# Patient Record
Sex: Female | Born: 1935 | Race: White | Hispanic: No | Smoking: Former smoker
Health system: Southern US, Community
[De-identification: ages and names within clinical notes are randomized; demographics above are authoritative.]

## PROBLEM LIST (undated history)

## (undated) DIAGNOSIS — H3530 Unspecified macular degeneration: Secondary | ICD-10-CM

## (undated) DIAGNOSIS — F0280 Dementia in other diseases classified elsewhere without behavioral disturbance: Secondary | ICD-10-CM

## (undated) DIAGNOSIS — R920 Mammographic microcalcification found on diagnostic imaging of breast: Secondary | ICD-10-CM

## (undated) DIAGNOSIS — G309 Alzheimer's disease, unspecified: Secondary | ICD-10-CM

## (undated) DIAGNOSIS — Z8489 Family history of other specified conditions: Secondary | ICD-10-CM

## (undated) DIAGNOSIS — I10 Essential (primary) hypertension: Secondary | ICD-10-CM

## (undated) DIAGNOSIS — E039 Hypothyroidism, unspecified: Secondary | ICD-10-CM

## (undated) DIAGNOSIS — C679 Malignant neoplasm of bladder, unspecified: Secondary | ICD-10-CM

## (undated) DIAGNOSIS — C539 Malignant neoplasm of cervix uteri, unspecified: Secondary | ICD-10-CM

## (undated) DIAGNOSIS — Z9841 Cataract extraction status, right eye: Secondary | ICD-10-CM

## (undated) DIAGNOSIS — G2581 Restless legs syndrome: Secondary | ICD-10-CM

## (undated) DIAGNOSIS — C50919 Malignant neoplasm of unspecified site of unspecified female breast: Secondary | ICD-10-CM

## (undated) DIAGNOSIS — D0590 Unspecified type of carcinoma in situ of unspecified breast: Secondary | ICD-10-CM

## (undated) DIAGNOSIS — Z9842 Cataract extraction status, left eye: Secondary | ICD-10-CM

## (undated) DIAGNOSIS — C189 Malignant neoplasm of colon, unspecified: Secondary | ICD-10-CM

## (undated) HISTORY — DX: Essential (primary) hypertension: I10

## (undated) HISTORY — PX: BREAST CYST EXCISION: SHX579

## (undated) HISTORY — PX: APPENDECTOMY: SHX54

## (undated) HISTORY — DX: Malignant neoplasm of unspecified site of unspecified female breast: C50.919

## (undated) HISTORY — DX: Mammographic microcalcification found on diagnostic imaging of breast: R92.0

## (undated) HISTORY — DX: Unspecified type of carcinoma in situ of unspecified breast: D05.90

## (undated) HISTORY — DX: Unspecified macular degeneration: H35.30

## (undated) HISTORY — PX: EYE SURGERY: SHX253

---

## 1958-06-25 HISTORY — PX: BREAST BIOPSY: SHX20

## 1970-06-25 HISTORY — PX: ABDOMINAL HYSTERECTOMY: SHX81

## 1989-06-25 DIAGNOSIS — I10 Essential (primary) hypertension: Secondary | ICD-10-CM

## 1989-06-25 HISTORY — DX: Essential (primary) hypertension: I10

## 1990-06-25 DIAGNOSIS — C189 Malignant neoplasm of colon, unspecified: Secondary | ICD-10-CM

## 1990-06-25 HISTORY — DX: Malignant neoplasm of colon, unspecified: C18.9

## 1990-06-25 HISTORY — PX: COLON SURGERY: SHX602

## 2001-06-25 HISTORY — PX: POLYPECTOMY: SHX149

## 2004-04-13 ENCOUNTER — Ambulatory Visit: Payer: Self-pay | Admitting: Oncology

## 2004-04-25 ENCOUNTER — Ambulatory Visit: Payer: Self-pay | Admitting: Oncology

## 2004-05-16 ENCOUNTER — Ambulatory Visit: Payer: Self-pay

## 2005-03-05 ENCOUNTER — Ambulatory Visit: Payer: Self-pay | Admitting: Gastroenterology

## 2005-04-12 ENCOUNTER — Ambulatory Visit: Payer: Self-pay | Admitting: Oncology

## 2005-04-25 ENCOUNTER — Ambulatory Visit: Payer: Self-pay | Admitting: Oncology

## 2005-06-25 HISTORY — PX: COLONOSCOPY: SHX174

## 2005-08-28 ENCOUNTER — Ambulatory Visit: Payer: Self-pay

## 2005-08-30 ENCOUNTER — Ambulatory Visit: Payer: Self-pay

## 2006-04-15 ENCOUNTER — Ambulatory Visit: Payer: Self-pay | Admitting: Oncology

## 2006-04-25 ENCOUNTER — Ambulatory Visit: Payer: Self-pay | Admitting: Oncology

## 2006-09-12 ENCOUNTER — Ambulatory Visit: Payer: Self-pay

## 2006-09-25 ENCOUNTER — Ambulatory Visit: Payer: Self-pay

## 2007-03-26 ENCOUNTER — Ambulatory Visit: Payer: Self-pay | Admitting: Oncology

## 2007-04-17 ENCOUNTER — Ambulatory Visit: Payer: Self-pay | Admitting: Oncology

## 2007-04-26 ENCOUNTER — Ambulatory Visit: Payer: Self-pay | Admitting: Oncology

## 2007-05-26 ENCOUNTER — Ambulatory Visit: Payer: Self-pay | Admitting: Oncology

## 2007-06-26 ENCOUNTER — Ambulatory Visit: Payer: Self-pay | Admitting: Oncology

## 2007-06-26 DIAGNOSIS — C679 Malignant neoplasm of bladder, unspecified: Secondary | ICD-10-CM

## 2007-06-26 HISTORY — PX: BLADDER SURGERY: SHX569

## 2007-06-26 HISTORY — DX: Malignant neoplasm of bladder, unspecified: C67.9

## 2007-07-28 ENCOUNTER — Ambulatory Visit: Payer: Self-pay | Admitting: Urology

## 2007-08-24 ENCOUNTER — Ambulatory Visit: Payer: Self-pay | Admitting: Oncology

## 2007-09-16 ENCOUNTER — Ambulatory Visit: Payer: Self-pay | Admitting: General Surgery

## 2008-03-25 ENCOUNTER — Ambulatory Visit: Payer: Self-pay | Admitting: Oncology

## 2008-04-15 ENCOUNTER — Ambulatory Visit: Payer: Self-pay | Admitting: Oncology

## 2008-04-25 ENCOUNTER — Ambulatory Visit: Payer: Self-pay | Admitting: Oncology

## 2008-06-25 HISTORY — PX: CYSTOSCOPY: SUR368

## 2008-09-16 ENCOUNTER — Ambulatory Visit: Payer: Self-pay | Admitting: Internal Medicine

## 2009-03-25 ENCOUNTER — Ambulatory Visit: Payer: Self-pay | Admitting: Oncology

## 2009-04-18 ENCOUNTER — Ambulatory Visit: Payer: Self-pay | Admitting: Oncology

## 2009-04-25 ENCOUNTER — Ambulatory Visit: Payer: Self-pay | Admitting: Oncology

## 2009-06-25 DIAGNOSIS — D0590 Unspecified type of carcinoma in situ of unspecified breast: Secondary | ICD-10-CM

## 2009-06-25 DIAGNOSIS — C50919 Malignant neoplasm of unspecified site of unspecified female breast: Secondary | ICD-10-CM

## 2009-06-25 HISTORY — PX: OTHER SURGICAL HISTORY: SHX169

## 2009-06-25 HISTORY — DX: Unspecified type of carcinoma in situ of unspecified breast: D05.90

## 2009-06-25 HISTORY — PX: BREAST BIOPSY: SHX20

## 2009-06-25 HISTORY — DX: Malignant neoplasm of unspecified site of unspecified female breast: C50.919

## 2009-10-17 ENCOUNTER — Ambulatory Visit: Payer: Self-pay | Admitting: Internal Medicine

## 2009-10-19 ENCOUNTER — Ambulatory Visit: Payer: Self-pay | Admitting: Internal Medicine

## 2009-11-14 ENCOUNTER — Ambulatory Visit: Payer: Self-pay | Admitting: General Surgery

## 2009-11-15 ENCOUNTER — Ambulatory Visit: Payer: Self-pay | Admitting: General Surgery

## 2009-11-15 HISTORY — PX: BREAST SURGERY: SHX581

## 2009-11-23 ENCOUNTER — Ambulatory Visit: Payer: Self-pay | Admitting: Radiation Oncology

## 2009-12-23 ENCOUNTER — Ambulatory Visit: Payer: Self-pay | Admitting: Radiation Oncology

## 2010-01-23 ENCOUNTER — Ambulatory Visit: Payer: Self-pay | Admitting: Radiation Oncology

## 2010-04-24 ENCOUNTER — Ambulatory Visit: Payer: Self-pay | Admitting: General Surgery

## 2010-06-25 DIAGNOSIS — H3530 Unspecified macular degeneration: Secondary | ICD-10-CM

## 2010-06-25 HISTORY — DX: Unspecified macular degeneration: H35.30

## 2010-11-01 ENCOUNTER — Ambulatory Visit: Payer: Self-pay | Admitting: General Surgery

## 2011-01-05 ENCOUNTER — Ambulatory Visit: Payer: Self-pay | Admitting: Surgery

## 2011-05-07 ENCOUNTER — Ambulatory Visit: Payer: Self-pay | Admitting: General Surgery

## 2011-11-29 ENCOUNTER — Ambulatory Visit: Payer: Self-pay | Admitting: General Surgery

## 2012-09-30 ENCOUNTER — Ambulatory Visit: Payer: Self-pay | Admitting: Internal Medicine

## 2012-12-01 ENCOUNTER — Ambulatory Visit: Payer: Self-pay | Admitting: General Surgery

## 2012-12-02 ENCOUNTER — Encounter: Payer: Self-pay | Admitting: General Surgery

## 2012-12-11 ENCOUNTER — Ambulatory Visit (INDEPENDENT_AMBULATORY_CARE_PROVIDER_SITE_OTHER): Payer: Medicare Other | Admitting: General Surgery

## 2012-12-11 ENCOUNTER — Encounter: Payer: Self-pay | Admitting: General Surgery

## 2012-12-11 VITALS — BP 138/76 | HR 76 | Resp 12 | Ht 64.0 in | Wt 192.0 lb

## 2012-12-11 DIAGNOSIS — Z853 Personal history of malignant neoplasm of breast: Secondary | ICD-10-CM | POA: Insufficient documentation

## 2012-12-11 NOTE — Progress Notes (Signed)
Patient ID: Leslie Osborn, female   DOB: [birth year], 79 y.o.   MRN: [id number]  Chief Complaint  Patient presents with  . Other    mammogram    HPI Leslie Osborn is a 79 y.o. female.  Who presents for a follow up mammogram. The most recent mammogram was done on 12-01-12.  Patient does perform regular self breast checks and gets regular mammograms done. No family history of breast cancer.  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  HPI  Past Medical History  Diagnosis Date  . Hypertension 1991  . Mammographic microcalcification   . Carcinoma in situ of breast 2011    R-breast  . Macular degeneration 2012  . Cancer of breast 2011    R-Breast DCIS  . Cancer of bladder 2009    Past Surgical History  Procedure Laterality Date  . Abdominal hysterectomy  1972  . Colon surgery  1992    resection  . Eye surgery  2000, 2010    cataract  . Breast surgery Right 11/15/09    lumpectomy  . Breast biopsy  1960  . Cystoscopy  2010  . Bladder surgery  2009  . Mammosite balloon placement  2011    Removed in 2011  . Polypectomy  2003  . Colonoscopy  2007    History reviewed. No pertinent family history.  Social History History  Substance Use Topics  . Smoking status: Never Smoker   . Smokeless tobacco: Never Used  . Alcohol Use: No    No Known Allergies  Current Outpatient Prescriptions  Medication Sig Dispense Refill  . alendronate (FOSAMAX) 70 MG tablet Take 70 mg by mouth every 7 (seven) days. Take with a full glass of water on an empty stomach.      Marland Kitchen atenolol (TENORMIN) 100 MG tablet Take 100 mg by mouth daily.      . bevacizumab (AVASTIN) 1.25 mg/0.1 mL SOLN 1.25 mg by Intravitreal route every 3 (three) months.      . Calcium Carbonate-Vitamin D (CALCIUM 500 + D PO) Take 3 tablets by mouth daily.      . cyanocobalamin 500 MCG tablet Take 500 mcg by mouth daily.      . folic acid (FOLVITE) 800 MCG tablet Take 400 mcg by mouth  daily.      Marland Kitchen levothyroxine (SYNTHROID, LEVOTHROID) 100 MCG tablet Take 100 mcg by mouth daily before breakfast.      . lisinopril (PRINIVIL,ZESTRIL) 20 MG tablet Take 40 mg by mouth daily.      . Multiple Vitamins-Minerals (OCUTABS) TABS Take 2 tablets by mouth daily.      . Omega 3-6-9 Fatty Acids (OMEGA 3-6-9 COMPLEX) CAPS Take 2 capsules by mouth daily.      . raloxifene (EVISTA) 60 MG tablet Take 60 mg by mouth daily.      . simvastatin (ZOCOR) 20 MG tablet Take 20 mg by mouth every evening.      . vitamin C (ASCORBIC ACID) 500 MG tablet Take 1,000 mg by mouth daily.       No current facility-administered medications for this visit.    Review of Systems Review of Systems  Constitutional: Negative.   Respiratory: Negative.   Cardiovascular: Negative.     Blood pressure 138/76, pulse 76, resp. rate 12, height 5\' 4"  (1.626 m), weight 192 lb (87.091 kg).  Physical Exam Physical Exam  Constitutional: She appears well-developed and well-nourished.  Eyes: Conjunctivae are normal.  Neck:  No tracheal deviation present. No mass and no thyromegaly present.  Cardiovascular: Normal rate and regular rhythm.   Pulmonary/Chest: Effort normal and breath sounds normal. Right breast exhibits no inverted nipple, no mass, no nipple discharge, no skin change and no tenderness. Left breast exhibits no inverted nipple, no mass, no nipple discharge, no skin change and no tenderness.  Lymphadenopathy:    She has no cervical adenopathy.    She has no axillary adenopathy.  Neurological: She is alert.  Skin: Skin is warm and dry.   Sebaceous cyst right axilla area   well healed right breast incision  Data Reviewed Mammogram reviewed and stable with post surgical changes in right breast.  Assessment    Stable exam    Plan    Annual mammogram and office visit.       Angus Amini G 12/11/2012, 7:43 PM

## 2012-12-11 NOTE — Patient Instructions (Addendum)
Continue self breast exams. Call office for any new breast issues or concerns. 1 year with mammogram and office visit

## 2013-12-03 ENCOUNTER — Encounter: Payer: Self-pay | Admitting: General Surgery

## 2013-12-03 ENCOUNTER — Ambulatory Visit: Payer: Self-pay | Admitting: General Surgery

## 2013-12-14 ENCOUNTER — Ambulatory Visit: Payer: Medicare Other | Admitting: General Surgery

## 2013-12-15 ENCOUNTER — Ambulatory Visit (INDEPENDENT_AMBULATORY_CARE_PROVIDER_SITE_OTHER): Payer: Medicare Other | Admitting: General Surgery

## 2013-12-15 ENCOUNTER — Encounter: Payer: Self-pay | Admitting: General Surgery

## 2013-12-15 VITALS — BP 162/82 | HR 70 | Resp 12 | Ht 64.5 in | Wt 197.0 lb

## 2013-12-15 DIAGNOSIS — D0590 Unspecified type of carcinoma in situ of unspecified breast: Secondary | ICD-10-CM

## 2013-12-15 DIAGNOSIS — D0511 Intraductal carcinoma in situ of right breast: Secondary | ICD-10-CM

## 2013-12-15 NOTE — Patient Instructions (Signed)
Follow up in one year with bilateral diagnostic mammogram and office visit.

## 2013-12-15 NOTE — Progress Notes (Signed)
Patient ID: Leslie Osborn, female   DOB: 07/29/1925, 80 y.o.   MRN: [id number]  Chief Complaint  Patient presents with  . Follow-up    mammogram    HPI Leslie Osborn is a 80 y.o. female.  who presents for a follow up mammogram breast evaluation. The most recent mammogram was done on 12-03-13.  Patient does perform regular self breast checks and gets regular mammograms done.  No new breast issues. No family history of breast cancer. 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   HPI  Past Medical History  Diagnosis Date  . Hypertension 1991  . Mammographic microcalcification   . Carcinoma in situ of breast 2011    R-breast  . Macular degeneration 2012  . Cancer of breast 2011    R-Breast DCIS, ER/PR negative  . Cancer of bladder 2009    Past Surgical History  Procedure Laterality Date  . Abdominal hysterectomy  1972  . Colon surgery  1992    resection  . Eye surgery  2000, 2010    cataract  . Breast surgery Right 11/15/09    lumpectomy  . Breast biopsy  1960  . Cystoscopy  2010  . Bladder surgery  2009  . Mammosite balloon placement  2011    Removed in 2011  . Polypectomy  2003  . Colonoscopy  2007    History reviewed. No pertinent family history.  Social History History  Substance Use Topics  . Smoking status: Never Smoker   . Smokeless tobacco: Never Used  . Alcohol Use: No    No Known Allergies  Current Outpatient Prescriptions  Medication Sig Dispense Refill  . alendronate (FOSAMAX) 70 MG tablet Take 70 mg by mouth every 7 (seven) days. Take with a full glass of water on an empty stomach.      Marland Kitchen atenolol (TENORMIN) 100 MG tablet Take 100 mg by mouth daily.      . Calcium Carbonate-Vitamin D (CALCIUM 500 + D PO) Take 3 tablets by mouth daily.      . folic acid (FOLVITE) 810 MCG tablet Take 400 mcg by mouth daily.      Marland Kitchen levothyroxine (SYNTHROID, LEVOTHROID) 100 MCG tablet Take 100 mcg by mouth daily before  breakfast.      . lisinopril (PRINIVIL,ZESTRIL) 20 MG tablet Take 40 mg by mouth daily.      . Multiple Vitamins-Minerals (OCUTABS) TABS Take 2 tablets by mouth daily.      Marland Kitchen PHOSPHATIDYLSERINE PO Take 500 mg by mouth daily.      . raloxifene (EVISTA) 60 MG tablet Take 60 mg by mouth daily.      . simvastatin (ZOCOR) 20 MG tablet Take 20 mg by mouth every evening.      . vitamin B-12 (CYANOCOBALAMIN) 500 MCG tablet Take 500 mcg by mouth daily.      . vitamin C (ASCORBIC ACID) 500 MG tablet Take 1,000 mg by mouth daily.       No current facility-administered medications for this visit.    Review of Systems Review of Systems  Constitutional: Negative.   Respiratory: Negative.   Cardiovascular: Negative.     Blood pressure 162/82, pulse 70, resp. rate 12, height 5' 4.5" (1.638 m), weight 197 lb (89.359 kg).  Physical Exam Physical Exam  Constitutional: She is oriented to person, place, and time. She appears well-developed and well-nourished.  Eyes: Conjunctivae are normal. No scleral icterus.  Neck: Neck supple.  Cardiovascular: Normal rate, regular rhythm and normal heart sounds.   Pulmonary/Chest: Effort normal and breath sounds normal. Right breast exhibits no inverted nipple, no mass, no nipple discharge, no skin change and no tenderness. Left breast exhibits no inverted nipple, no mass, no nipple discharge, no skin change and no tenderness.  Abdominal: Soft. Normal appearance and bowel sounds are normal. There is no hepatosplenomegaly. There is no tenderness.  Lymphadenopathy:    She has no cervical adenopathy.    She has no axillary adenopathy.  Neurological: She is alert and oriented to person, place, and time.  Skin: Skin is warm and dry.    Data Reviewed Mammogram reviewed and stable.  Assessment    Stable physical exam. Patient is 4 years out from right breast lumpectomy and radiation, DCIS ER/PR negative.    Plan    Follow up in one year with bilateral diagnostic  mammogram and office visit.     PCP: Trudie Reed 12/16/2013, 5:35 AM

## 2013-12-16 ENCOUNTER — Encounter: Payer: Self-pay | Admitting: General Surgery

## 2014-04-26 ENCOUNTER — Encounter: Payer: Self-pay | Admitting: General Surgery

## 2014-11-15 ENCOUNTER — Other Ambulatory Visit: Payer: Self-pay

## 2014-11-15 DIAGNOSIS — Z853 Personal history of malignant neoplasm of breast: Secondary | ICD-10-CM

## 2014-11-30 ENCOUNTER — Other Ambulatory Visit: Payer: Self-pay

## 2014-11-30 DIAGNOSIS — D0511 Intraductal carcinoma in situ of right breast: Secondary | ICD-10-CM

## 2014-12-17 ENCOUNTER — Ambulatory Visit: Payer: Self-pay

## 2014-12-17 ENCOUNTER — Ambulatory Visit
Admission: RE | Admit: 2014-12-17 | Discharge: 2014-12-17 | Disposition: A | Discharge status: Home / Self Care | Payer: Medicare Other | Source: Ambulatory Visit | Attending: General Surgery | Admitting: General Surgery

## 2014-12-17 DIAGNOSIS — R922 Inconclusive mammogram: Secondary | ICD-10-CM | POA: Diagnosis present

## 2014-12-17 DIAGNOSIS — D0511 Intraductal carcinoma in situ of right breast: Secondary | ICD-10-CM

## 2014-12-17 DIAGNOSIS — Z853 Personal history of malignant neoplasm of breast: Secondary | ICD-10-CM | POA: Diagnosis not present

## 2014-12-17 HISTORY — DX: Malignant neoplasm of colon, unspecified: C18.9

## 2014-12-29 ENCOUNTER — Ambulatory Visit: Payer: Self-pay | Admitting: General Surgery

## 2015-01-03 ENCOUNTER — Encounter: Payer: Self-pay | Admitting: General Surgery

## 2015-01-04 ENCOUNTER — Encounter: Payer: Self-pay | Admitting: General Surgery

## 2015-01-04 ENCOUNTER — Ambulatory Visit (INDEPENDENT_AMBULATORY_CARE_PROVIDER_SITE_OTHER): Payer: Medicare Other | Admitting: General Surgery

## 2015-01-04 VITALS — BP 152/72 | HR 74 | Resp 14 | Ht 64.0 in

## 2015-01-04 DIAGNOSIS — D0511 Intraductal carcinoma in situ of right breast: Secondary | ICD-10-CM | POA: Diagnosis not present

## 2015-01-04 DIAGNOSIS — Z85038 Personal history of other malignant neoplasm of large intestine: Secondary | ICD-10-CM | POA: Diagnosis not present

## 2015-01-04 NOTE — Progress Notes (Signed)
Patient ID: Leslie Osborn, female   DOB: 06/24/1926, 81 y.o.   MRN: [id number]  Chief Complaint  Patient presents with  . Follow-up    mammogram    HPI Leslie Osborn is a 81 y.o. female who presents for a breast cancer follow up her most recent mammogram was done on 12/17/14.  Patient does perform regular self breast checks and gets regular mammograms done.    HPI  Past Medical History  Diagnosis Date  . Hypertension 1991  . Mammographic microcalcification   . Carcinoma in situ of breast 2011    R-breast  . Macular degeneration 2012  . Cancer of breast 2011    R-Breast DCIS, ER/PR negative  . Colon cancer 2009    chemo    Past Surgical History  Procedure Laterality Date  . Abdominal hysterectomy  1972  . Eye surgery  2000, 2010    cataract  . Breast surgery Right 11/15/09    lumpectomy  . Cystoscopy  2010  . Bladder surgery  2009  . Mammosite balloon placement  2011    Removed in 2011  . Polypectomy  2003  . Colonoscopy  2007  . Breast cyst excision Right   . Breast biopsy  1960  . Breast biopsy Right 2011    positive- radiation  . Colon surgery  1992    resection, chemo    No family history on file.  Social History History  Substance Use Topics  . Smoking status: Never Smoker   . Smokeless tobacco: Never Used  . Alcohol Use: No    No Known Allergies  Current Outpatient Prescriptions  Medication Sig Dispense Refill  . alendronate (FOSAMAX) 70 MG tablet Take 70 mg by mouth every 7 (seven) days. Take with a full glass of water on an empty stomach.    Marland Kitchen atenolol (TENORMIN) 100 MG tablet Take 100 mg by mouth daily.    . Calcium Carbonate-Vitamin D (CALCIUM 500 + D PO) Take 3 tablets by mouth daily.    . folic acid (FOLVITE) 188 MCG tablet Take 400 mcg by mouth daily.    Marland Kitchen levothyroxine (SYNTHROID, LEVOTHROID) 100 MCG tablet Take 100 mcg by mouth daily before breakfast.    . lisinopril (PRINIVIL,ZESTRIL) 20 MG tablet Take 40 mg by mouth daily.    .  Multiple Vitamins-Minerals (OCUTABS) TABS Take 2 tablets by mouth daily.    Marland Kitchen PHOSPHATIDYLSERINE PO Take 500 mg by mouth daily.    . raloxifene (EVISTA) 60 MG tablet Take 60 mg by mouth daily.    . simvastatin (ZOCOR) 20 MG tablet Take 20 mg by mouth every evening.    . vitamin B-12 (CYANOCOBALAMIN) 500 MCG tablet Take 500 mcg by mouth daily.    . vitamin C (ASCORBIC ACID) 500 MG tablet Take 1,000 mg by mouth daily.     No current facility-administered medications for this visit.    Review of Systems Review of Systems  Constitutional: Negative.   Respiratory: Negative.   Cardiovascular: Negative.   Gastrointestinal: Negative.     Blood pressure 152/72, pulse 74, resp. rate 14, height 5\' 4"  (1.626 m).  Physical Exam Physical Exam  Constitutional: She is oriented to person, place, and time. She appears well-developed and well-nourished.  Eyes: Conjunctivae are normal. No scleral icterus.  Neck: Neck supple.  Cardiovascular: Normal rate, regular rhythm and normal heart sounds.   Pulmonary/Chest: Effort normal and breath sounds normal. Right breast exhibits no inverted nipple, no mass, no nipple discharge, no  skin change and no tenderness. Left breast exhibits no inverted nipple, no mass, no nipple discharge, no skin change and no tenderness.  Abdominal: Soft. Normal appearance and bowel sounds are normal. There is no hepatomegaly. There is no tenderness.  Lymphadenopathy:    She has no cervical adenopathy.    She has no axillary adenopathy.  Neurological: She is alert and oriented to person, place, and time.  Skin: Skin is warm and dry.    Data Reviewed Mammogram reviewed-stable Assessment    Stable exam, 5 years out from right breast lumpectomy and radiation, DCIS ER/PR negative.   History of colon cancer Plan   Dicussed colonoscopy or stool check, patient declined both.  The patient has been asked to return to the office in one year with a bilateral diagnotic mammogram.  Continue self breast exams. Call office for any new breast issues or concerns.      PCP:  Leonia Reeves 01/04/2015, 11:21 AM

## 2015-01-04 NOTE — Patient Instructions (Addendum)
The patient has been asked to return to the office in one year with a bilateral diagnotic mammogram.Continue self breast exams. Call office for any new breast issues or concerns.

## 2015-09-27 ENCOUNTER — Other Ambulatory Visit: Payer: Self-pay | Admitting: *Deleted

## 2015-09-27 DIAGNOSIS — Z853 Personal history of malignant neoplasm of breast: Secondary | ICD-10-CM

## 2015-09-29 ENCOUNTER — Encounter: Payer: Self-pay | Admitting: *Deleted

## 2015-12-19 ENCOUNTER — Other Ambulatory Visit: Payer: Self-pay | Admitting: General Surgery

## 2015-12-19 ENCOUNTER — Ambulatory Visit
Admission: RE | Admit: 2015-12-19 | Discharge: 2015-12-19 | Disposition: A | Discharge status: Home / Self Care | Payer: Medicare Other | Source: Ambulatory Visit | Attending: General Surgery | Admitting: General Surgery

## 2015-12-19 DIAGNOSIS — Z853 Personal history of malignant neoplasm of breast: Secondary | ICD-10-CM

## 2016-01-02 ENCOUNTER — Ambulatory Visit (INDEPENDENT_AMBULATORY_CARE_PROVIDER_SITE_OTHER): Payer: Medicare Other | Admitting: General Surgery

## 2016-01-02 ENCOUNTER — Encounter: Payer: Self-pay | Admitting: General Surgery

## 2016-01-02 VITALS — BP 130/82 | HR 70 | Resp 14 | Ht 64.5 in | Wt 190.0 lb

## 2016-01-02 DIAGNOSIS — R319 Hematuria, unspecified: Secondary | ICD-10-CM

## 2016-01-02 DIAGNOSIS — D0511 Intraductal carcinoma in situ of right breast: Secondary | ICD-10-CM

## 2016-01-02 NOTE — Progress Notes (Signed)
Patient ID: Leslie Osborn, female   DOB: February 18, 1926, 82 y.o.   MRN: [id number]  Chief Complaint  Patient presents with  . Follow-up    mammogram    HPI Leslie Osborn is a 82 y.o. female who presents for a breast cancer follow up. The most recent mammogram was done on 12/19/15. Patient does perform regular self breast checks and gets regular mammograms done. She reports no new breast problems. She reports some blood clots after urinating.  Has a history of bladder cancer in the past  I have reviewed the history of present illness with the patient.    HPI  Past Medical History  Diagnosis Date  . Hypertension 1991  . Mammographic microcalcification   . Carcinoma in situ of breast 2011    R-breast  . Macular degeneration 2012  . Cancer of breast [facility]) 2011    R-Breast DCIS, ER/PR negative  . Colon cancer ([place]) 1992    chemo    Past Surgical History  Procedure Laterality Date  . Abdominal hysterectomy  1972  . Eye surgery  2000, 2010    cataract  . Breast surgery Right 11/15/09    lumpectomy  . Cystoscopy  2010  . Bladder surgery  2009  . Mammosite balloon placement  2011    Removed in 2011  . Polypectomy  2003  . Colonoscopy  2007  . Breast cyst excision Right   . Breast biopsy  1960  . Breast biopsy Right 2011    positive- radiation  . Colon surgery  1992    resection, chemo    History reviewed. No pertinent family history.  Social History Social History  Substance Use Topics  . Smoking status: Never Smoker   . Smokeless tobacco: Never Used  . Alcohol Use: No    No Known Allergies  Current Outpatient Prescriptions  Medication Sig Dispense Refill  . atenolol (TENORMIN) 100 MG tablet Take 100 mg by mouth daily.    . Calcium Carbonate-Vitamin D (CALCIUM 500 + D PO) Take 3 tablets by mouth daily.    . folic acid (FOLVITE) [id number] MCG tablet Take 400 mcg by mouth daily.    Marland Kitchen galantamine (RAZADYNE) 8 MG tablet Take 8 mg by mouth 2 (two) times daily.    Marland Kitchen  levothyroxine (SYNTHROID, LEVOTHROID) 100 MCG tablet Take 100 mcg by mouth daily before breakfast.    . lisinopril (PRINIVIL,ZESTRIL) 20 MG tablet Take 40 mg by mouth daily.    . magnesium oxide (MAG-OX) 400 MG tablet Take 400 mg by mouth daily.    . Multiple Vitamins-Minerals (OCUTABS) TABS Take 2 tablets by mouth daily.    Marland Kitchen rOPINIRole (REQUIP) 1 MG tablet Take 1 mg by mouth at bedtime.    . vitamin B-12 (CYANOCOBALAMIN) 500 MCG tablet Take 500 mcg by mouth daily.    . vitamin C (ASCORBIC ACID) 500 MG tablet Take 1,000 mg by mouth daily. With Rose Hips     No current facility-administered medications for this visit.    Review of Systems Review of Systems  Constitutional: Negative.   Respiratory: Negative.   Cardiovascular: Negative.     Blood pressure 130/82, pulse 70, resp. rate 14, height 5' 4.5" (1.638 m), weight 190 lb (86.183 kg).  Physical Exam Physical Exam  Constitutional: She is oriented to person, place, and time. She appears well-developed and well-nourished.  Eyes: Conjunctivae are normal. No scleral icterus.  Neck: Neck supple.  Cardiovascular: Normal rate, regular rhythm and normal heart sounds.  Pulmonary/Chest: Effort normal and breath sounds normal. Right breast exhibits no inverted nipple, no mass, no nipple discharge, no skin change and no tenderness. Left breast exhibits no inverted nipple, no mass, no nipple discharge, no skin change and no tenderness.  Abdominal: Soft. Normal appearance and bowel sounds are normal.  Lymphadenopathy:    She has no cervical adenopathy.    She has no axillary adenopathy.  Neurological: She is alert and oriented to person, place, and time.  Skin: Skin is warm and dry.    Data Reviewed Mammogram stable  Assessment    History of DCIS. Stable exam. New complaints of blood with urination     Plan    Referral to Dr Maryan Puls for urinary bleeding. An appointment has been scheduled for Thursday, 01-05-16 at 9 am.    Follow up in one year with bilateral screening mammogram and office visit.     PCP: Hall Busing This has been scribed by Lesly Rubenstein LPN    Christene Lye 01/02/2016, 3:18 PM

## 2016-01-02 NOTE — Patient Instructions (Signed)
Referral to Dr Maryan Puls for urinary bleeding Follow up in one year with Bilateral Diagnostic Mammogram and office visit.

## 2016-01-05 ENCOUNTER — Other Ambulatory Visit: Payer: Self-pay | Admitting: Urology

## 2016-01-05 DIAGNOSIS — R310 Gross hematuria: Secondary | ICD-10-CM

## 2016-01-16 ENCOUNTER — Ambulatory Visit
Admission: RE | Admit: 2016-01-16 | Discharge: 2016-01-16 | Disposition: A | Discharge status: Home / Self Care | Payer: Medicare Other | Source: Ambulatory Visit | Attending: Urology | Admitting: Urology

## 2016-01-16 DIAGNOSIS — I719 Aortic aneurysm of unspecified site, without rupture: Secondary | ICD-10-CM | POA: Insufficient documentation

## 2016-01-16 DIAGNOSIS — R310 Gross hematuria: Secondary | ICD-10-CM | POA: Insufficient documentation

## 2016-01-16 DIAGNOSIS — N2889 Other specified disorders of kidney and ureter: Secondary | ICD-10-CM | POA: Diagnosis not present

## 2016-01-16 DIAGNOSIS — N3289 Other specified disorders of bladder: Secondary | ICD-10-CM | POA: Insufficient documentation

## 2016-01-16 DIAGNOSIS — K8020 Calculus of gallbladder without cholecystitis without obstruction: Secondary | ICD-10-CM | POA: Diagnosis not present

## 2016-01-16 DIAGNOSIS — I700 Atherosclerosis of aorta: Secondary | ICD-10-CM | POA: Diagnosis not present

## 2016-01-16 LAB — POCT I-STAT CREATININE: CREATININE: 0.8 mg/dL (ref 0.44–1.00)

## 2016-01-16 MED ORDER — IOPAMIDOL (ISOVUE-300) INJECTION 61%
125.0000 mL | Freq: Once | INTRAVENOUS | Status: AC | PRN
  Administered 2016-01-16: 125 mL via INTRAVENOUS

## 2016-01-27 ENCOUNTER — Encounter
Admission: RE | Admit: 2016-01-27 | Discharge: 2016-01-27 | Disposition: A | Discharge status: Home / Self Care | Payer: Medicare Other | Source: Ambulatory Visit | Attending: Orthopedic Surgery | Admitting: Orthopedic Surgery

## 2016-01-27 ENCOUNTER — Other Ambulatory Visit: Payer: Self-pay

## 2016-01-27 DIAGNOSIS — I10 Essential (primary) hypertension: Secondary | ICD-10-CM | POA: Diagnosis not present

## 2016-01-27 DIAGNOSIS — Z01810 Encounter for preprocedural cardiovascular examination: Secondary | ICD-10-CM | POA: Diagnosis not present

## 2016-01-27 DIAGNOSIS — Z01812 Encounter for preprocedural laboratory examination: Secondary | ICD-10-CM | POA: Insufficient documentation

## 2016-01-27 HISTORY — DX: Dementia in other diseases classified elsewhere, unspecified severity, without behavioral disturbance, psychotic disturbance, mood disturbance, and anxiety: F02.80

## 2016-01-27 HISTORY — DX: Alzheimer's disease, unspecified: G30.9

## 2016-01-27 HISTORY — DX: Family history of other specified conditions: Z84.89

## 2016-01-27 HISTORY — DX: Cataract extraction status, left eye: Z98.42

## 2016-01-27 HISTORY — DX: Cataract extraction status, right eye: Z98.41

## 2016-01-27 HISTORY — DX: Malignant neoplasm of cervix uteri, unspecified: C53.9

## 2016-01-27 HISTORY — DX: Restless legs syndrome: G25.81

## 2016-01-27 HISTORY — DX: Malignant neoplasm of bladder, unspecified: C67.9

## 2016-01-27 HISTORY — DX: Hypothyroidism, unspecified: E03.9

## 2016-01-27 LAB — BASIC METABOLIC PANEL
ANION GAP: 8 (ref 5–15)
BUN: 15 mg/dL (ref 6–20)
CALCIUM: 9.9 mg/dL (ref 8.9–10.3)
CO2: 31 mmol/L (ref 22–32)
Chloride: 105 mmol/L (ref 101–111)
Creatinine, Ser: 0.69 mg/dL (ref 0.44–1.00)
GLUCOSE: 91 mg/dL (ref 65–99)
Potassium: 4.4 mmol/L (ref 3.5–5.1)
Sodium: 144 mmol/L (ref 135–145)

## 2016-01-27 NOTE — Pre-Procedure Instructions (Signed)
Compared today's EKG with prior EKG from 11/14/2009 no change.

## 2016-01-27 NOTE — Patient Instructions (Signed)
  Your procedure is scheduled [id number] January 31, 2016. Report to Same Day Surgery. To find out your arrival time please call [phone] between 1PM - 3PM on Monday January 30, 2016.  Remember: Instructions that are not followed completely may result in serious medical risk, up to and including death, or upon the discretion of your surgeon and anesthesiologist your surgery may need to be rescheduled.    _x___ 1. Do not eat food or drink liquids after midnight. No gum chewing or hard candies.     ____ 2. No Alcohol for 24 hours before or after surgery.   ____ 3. Bring all medications with you on the day of surgery if instructed.    __x__ 4. Notify your doctor if there is any change in your medical condition     (cold, fever, infections).     Do not wear jewelry, make-up, hairpins, clips or nail polish.  Do not wear lotions, powders, or perfumes. You may wear deodorant.  Do not shave 48 hours prior to surgery. Men may shave face and neck.  Do not bring valuables to the hospital.    [facility] is not responsible for any belongings or valuables.               Contacts, dentures or bridgework may not be worn into surgery.  Leave your suitcase in the car. After surgery it may be brought to your room.  For patients admitted to the hospital, discharge time is determined by your treatment team.   Patients discharged the day of surgery will not be allowed to drive home.    Please read over the following fact sheets that you were given:   [facility] Preparing for Surgery  __x__ Take these medicines the morning of surgery with A SIP OF WATER:    1. atenolol (TENORMIN)   2. levothyroxine (SYNTHROID, LEVOTHROID)  3. lisinopril (PRINIVIL,ZESTRIL)    ____ Fleet Enema (as directed)   ____ Use CHG Soap as directed on instruction sheet  ____ Use inhalers on the day of surgery and bring to hospital day of surgery  ____ Stop metformin 2 days prior to surgery    ____ Take 1/2 of usual  insulin dose the night before surgery and none on the morning of surgery.   ____ Stop Coumadin/Plavix/aspirin on does not apply.  _x___ Stop Anti-inflammatories such as Advil, Aleve, Ibuprofen, Motrin, Naproxen, Naprosyn, Goodies powders or aspirin products.OK to take Tylenol.  _x___ Stop supplements:Ascorbic Acid (VITAMIN C WITH ROSE HIPS), folic acid (FOLVITE),vitamin B-12     until after surgery.    ____ Bring C-Pap to the hospital.

## 2016-01-30 NOTE — Pre-Procedure Instructions (Signed)
Called Dr. Letta Kocher office to request H+P to be faxed to [facility] pre-op.

## 2016-01-31 ENCOUNTER — Ambulatory Visit: Payer: Medicare Other | Admitting: Anesthesiology

## 2016-01-31 ENCOUNTER — Ambulatory Visit
Admission: RE | Admit: 2016-01-31 | Discharge: 2016-01-31 | Disposition: A | Discharge status: Home / Self Care | Payer: Medicare Other | Source: Ambulatory Visit | Attending: Urology | Admitting: Urology

## 2016-01-31 ENCOUNTER — Encounter: Payer: Self-pay | Admitting: Anesthesiology

## 2016-01-31 ENCOUNTER — Encounter
Admission: RE | Disposition: A | Discharge status: Home / Self Care | Payer: Self-pay | Source: Ambulatory Visit | Attending: Urology

## 2016-01-31 DIAGNOSIS — Z809 Family history of malignant neoplasm, unspecified: Secondary | ICD-10-CM | POA: Diagnosis not present

## 2016-01-31 DIAGNOSIS — Z9221 Personal history of antineoplastic chemotherapy: Secondary | ICD-10-CM | POA: Insufficient documentation

## 2016-01-31 DIAGNOSIS — Z823 Family history of stroke: Secondary | ICD-10-CM | POA: Diagnosis not present

## 2016-01-31 DIAGNOSIS — I440 Atrioventricular block, first degree: Secondary | ICD-10-CM | POA: Diagnosis not present

## 2016-01-31 DIAGNOSIS — Z853 Personal history of malignant neoplasm of breast: Secondary | ICD-10-CM | POA: Insufficient documentation

## 2016-01-31 DIAGNOSIS — C673 Malignant neoplasm of anterior wall of bladder: Secondary | ICD-10-CM | POA: Insufficient documentation

## 2016-01-31 DIAGNOSIS — I4510 Unspecified right bundle-branch block: Secondary | ICD-10-CM | POA: Diagnosis not present

## 2016-01-31 DIAGNOSIS — I491 Atrial premature depolarization: Secondary | ICD-10-CM | POA: Diagnosis not present

## 2016-01-31 DIAGNOSIS — Z87891 Personal history of nicotine dependence: Secondary | ICD-10-CM | POA: Insufficient documentation

## 2016-01-31 DIAGNOSIS — H3530 Unspecified macular degeneration: Secondary | ICD-10-CM | POA: Diagnosis not present

## 2016-01-31 DIAGNOSIS — C679 Malignant neoplasm of bladder, unspecified: Secondary | ICD-10-CM | POA: Diagnosis present

## 2016-01-31 DIAGNOSIS — J309 Allergic rhinitis, unspecified: Secondary | ICD-10-CM | POA: Insufficient documentation

## 2016-01-31 DIAGNOSIS — Z9849 Cataract extraction status, unspecified eye: Secondary | ICD-10-CM | POA: Insufficient documentation

## 2016-01-31 DIAGNOSIS — Z79899 Other long term (current) drug therapy: Secondary | ICD-10-CM | POA: Insufficient documentation

## 2016-01-31 DIAGNOSIS — I10 Essential (primary) hypertension: Secondary | ICD-10-CM | POA: Diagnosis not present

## 2016-01-31 DIAGNOSIS — Z86010 Personal history of colonic polyps: Secondary | ICD-10-CM | POA: Diagnosis not present

## 2016-01-31 DIAGNOSIS — Z90710 Acquired absence of both cervix and uterus: Secondary | ICD-10-CM | POA: Diagnosis not present

## 2016-01-31 DIAGNOSIS — Z85038 Personal history of other malignant neoplasm of large intestine: Secondary | ICD-10-CM | POA: Insufficient documentation

## 2016-01-31 DIAGNOSIS — E039 Hypothyroidism, unspecified: Secondary | ICD-10-CM | POA: Diagnosis not present

## 2016-01-31 DIAGNOSIS — E7800 Pure hypercholesterolemia, unspecified: Secondary | ICD-10-CM | POA: Insufficient documentation

## 2016-01-31 HISTORY — PX: TRANSURETHRAL RESECTION OF BLADDER TUMOR: SHX2575

## 2016-01-31 SURGERY — TURBT (TRANSURETHRAL RESECTION OF BLADDER TUMOR)
Anesthesia: General | Site: Bladder | Wound class: Clean Contaminated

## 2016-01-31 MED ORDER — PROPOFOL 10 MG/ML IV BOLUS
INTRAVENOUS | Status: DC | PRN
  Administered 2016-01-31: 150 mg via INTRAVENOUS
  Administered 2016-01-31: 50 mg via INTRAVENOUS

## 2016-01-31 MED ORDER — ONDANSETRON HCL 4 MG/2ML IJ SOLN
INTRAMUSCULAR | Status: DC | PRN
  Administered 2016-01-31: 4 mg via INTRAVENOUS

## 2016-01-31 MED ORDER — BELLADONNA ALKALOIDS-OPIUM 16.2-60 MG RE SUPP
RECTAL | Status: AC
  Filled 2016-01-31: qty 1

## 2016-01-31 MED ORDER — UROGESIC-BLUE 81.6 MG PO TABS: 1.0000 | ORAL_TABLET | Freq: Four times a day (QID) | ORAL | 3 refills | Status: DC | PRN

## 2016-01-31 MED ORDER — MITOMYCIN CHEMO FOR BLADDER INSTILLATION 40 MG
40.0000 mg | Freq: Once | INTRAVENOUS | Status: AC
  Administered 2016-01-31: 40 mg via INTRAVESICAL
  Filled 2016-01-31: qty 40

## 2016-01-31 MED ORDER — FAMOTIDINE 20 MG PO TABS
ORAL_TABLET | ORAL | Status: AC
  Filled 2016-01-31: qty 1

## 2016-01-31 MED ORDER — LEVOFLOXACIN IN D5W 500 MG/100ML IV SOLN
500.0000 mg | INTRAVENOUS | Status: DC
  Administered 2016-01-31: 500 mg via INTRAVENOUS

## 2016-01-31 MED ORDER — LIDOCAINE HCL 2 % EX GEL
CUTANEOUS | Status: AC
  Filled 2016-01-31: qty 10

## 2016-01-31 MED ORDER — FAMOTIDINE 20 MG PO TABS
20.0000 mg | ORAL_TABLET | Freq: Once | ORAL | Status: AC
  Administered 2016-01-31: 20 mg via ORAL

## 2016-01-31 MED ORDER — MITOMYCIN CHEMO FOR BLADDER INSTILLATION 40 MG
40.0000 mg | Freq: Once | INTRAVENOUS | Status: DC
  Filled 2016-01-31: qty 40

## 2016-01-31 MED ORDER — GLYCOPYRROLATE 0.2 MG/ML IJ SOLN
INTRAMUSCULAR | Status: DC | PRN
  Administered 2016-01-31: 0.2 mg via INTRAVENOUS

## 2016-01-31 MED ORDER — LEVOFLOXACIN IN D5W 500 MG/100ML IV SOLN
INTRAVENOUS | Status: DC
Start: 2016-01-31 — End: 2016-01-31
  Filled 2016-01-31: qty 100

## 2016-01-31 MED ORDER — LACTATED RINGERS IV SOLN
INTRAVENOUS | Status: DC
  Administered 2016-01-31: 15:00:00 via INTRAVENOUS

## 2016-01-31 MED ORDER — FENTANYL CITRATE (PF) 100 MCG/2ML IJ SOLN
INTRAMUSCULAR | Status: DC | PRN
  Administered 2016-01-31 (×2): 50 ug via INTRAVENOUS

## 2016-01-31 MED ORDER — LIDOCAINE HCL (CARDIAC) 20 MG/ML IV SOLN
INTRAVENOUS | Status: DC | PRN
  Administered 2016-01-31: 80 mg via INTRAVENOUS

## 2016-01-31 MED ORDER — ACETAMINOPHEN-CODEINE #3 300-30 MG PO TABS: 1.0000 | ORAL_TABLET | ORAL | 2 refills | Status: DC | PRN

## 2016-01-31 MED ORDER — DOCUSATE SODIUM 100 MG PO CAPS: 200.0000 mg | ORAL_CAPSULE | Freq: Two times a day (BID) | ORAL | 3 refills | Status: DC

## 2016-01-31 MED ORDER — DEXAMETHASONE SODIUM PHOSPHATE 10 MG/ML IJ SOLN
INTRAMUSCULAR | Status: DC | PRN
  Administered 2016-01-31: 5 mg via INTRAVENOUS

## 2016-01-31 MED ORDER — EPHEDRINE SULFATE 50 MG/ML IJ SOLN
INTRAMUSCULAR | Status: DC | PRN
  Administered 2016-01-31: 10 mg via INTRAVENOUS

## 2016-01-31 MED ORDER — BELLADONNA ALKALOIDS-OPIUM 16.2-60 MG RE SUPP
RECTAL | Status: DC | PRN
  Administered 2016-01-31: 1 via RECTAL

## 2016-01-31 MED ORDER — LEVOFLOXACIN 500 MG PO TABS: 500.0000 mg | ORAL_TABLET | Freq: Every day | ORAL | 0 refills | Status: DC

## 2016-01-31 SURGICAL SUPPLY — 26 items
BAG DRAIN CYSTO-URO LG1000N (MISCELLANEOUS) ×3 IMPLANT
BAG URO DRAIN 2000ML W/SPOUT (MISCELLANEOUS) ×3 IMPLANT
CATH FOLEY 2WAY  5CC 20FR SIL (CATHETERS) ×2
CATH FOLEY 2WAY 5CC 20FR SIL (CATHETERS) ×1 IMPLANT
ELECT BIPOLAR RESECTOSCOPE (ELECTROSURGICAL) ×3 IMPLANT
ELECT LOOP 22F BIPOLAR SML (ELECTROSURGICAL) ×3
ELECT REM PT RETURN 9FT ADLT (ELECTROSURGICAL) ×3
ELECTRODE LOOP 22F BIPOLAR SML (ELECTROSURGICAL) ×1 IMPLANT
ELECTRODE REM PT RTRN 9FT ADLT (ELECTROSURGICAL) ×1 IMPLANT
GLOVE BIO SURGEON STRL SZ7 (GLOVE) ×6 IMPLANT
GLOVE BIO SURGEON STRL SZ7.5 (GLOVE) ×3 IMPLANT
GOWN STRL REUS W/ TWL LRG LVL4 (GOWN DISPOSABLE) ×1 IMPLANT
GOWN STRL REUS W/ TWL XL LVL3 (GOWN DISPOSABLE) ×1 IMPLANT
GOWN STRL REUS W/TWL LRG LVL4 (GOWN DISPOSABLE) ×2
GOWN STRL REUS W/TWL XL LVL3 (GOWN DISPOSABLE) ×2
KIT RM TURNOVER CYSTO AR (KITS) ×3 IMPLANT
LOOP CUT BIPOLAR 24F LRG (ELECTROSURGICAL) ×3 IMPLANT
PACK CYSTO AR (MISCELLANEOUS) ×3 IMPLANT
PLUG CATH AND CAP STER (CATHETERS) ×3 IMPLANT
PREP PVP WINGED SPONGE (MISCELLANEOUS) ×3 IMPLANT
SET IRRIG Y TYPE TUR BLADDER L (SET/KITS/TRAYS/PACK) ×3 IMPLANT
SOL .9 NS 3000ML IRR  AL (IV SOLUTION) ×14
SOL .9 NS 3000ML IRR UROMATIC (IV SOLUTION) ×7 IMPLANT
SYRINGE IRR TOOMEY STRL 70CC (SYRINGE) ×3 IMPLANT
WATER STERILE IRR 1000ML POUR (IV SOLUTION) ×3 IMPLANT
WATER STERILE IRR 3000ML UROMA (IV SOLUTION) IMPLANT

## 2016-01-31 NOTE — Anesthesia Procedure Notes (Signed)
Procedure Name: LMA Insertion Date/Time: 01/31/2016 3:56 PM Performed by: Aline Brochure Pre-anesthesia Checklist: Patient identified, Emergency Drugs available, Suction available and Patient being monitored Patient Re-evaluated:Patient Re-evaluated prior to inductionOxygen Delivery Method: Circle system utilized Preoxygenation: Pre-oxygenation with 100% oxygen Intubation Type: IV induction Ventilation: Mask ventilation without difficulty LMA: LMA inserted LMA Size: 3.5 Number of attempts: 1 Placement Confirmation: breath sounds checked- equal and bilateral Tube secured with: Tape Dental Injury: Teeth and Oropharynx as per pre-operative assessment

## 2016-01-31 NOTE — Anesthesia Preprocedure Evaluation (Addendum)
Anesthesia Evaluation  Patient identified by MRN, date of birth, ID band Patient awake  General Assessment Comment:Family Hx of nausea post surgery  Reviewed: Allergy & Precautions, NPO status , Patient's Chart, lab work & pertinent test results, reviewed documented beta blocker date and time   History of Anesthesia Complications (+) Family history of anesthesia reaction  Airway Mallampati: III       Dental  (+) Upper Dentures, Lower Dentures   Pulmonary former smoker,    Pulmonary exam normal        Cardiovascular hypertension, Pt. on medications and Pt. on home beta blockers Normal cardiovascular exam     Neuro/Psych PSYCHIATRIC DISORDERS alzheimersalzheimers    GI/Hepatic Neg liver ROS, Colon CA Hx   Endo/Other  Hypothyroidism   Renal/GU negative Renal ROS Bladder dysfunction      Musculoskeletal   Abdominal Normal abdominal exam  (+)   Peds negative pediatric ROS (+)  Hematology negative hematology ROS (+)   Anesthesia Other Findings Colon CA Breast CA HTN Alzheimers Bladder tumors Hypothyroid Dz Macular degeneration  Reproductive/Obstetrics                            Anesthesia Physical Anesthesia Plan  ASA: III  Anesthesia Plan: General   Post-op Pain Management:    Induction:   Airway Management Planned: Oral ETT  Additional Equipment:   Intra-op Plan:   Post-operative Plan: Extubation in OR  Informed Consent: I have reviewed the patients History and Physical, chart, labs and discussed the procedure including the risks, benefits and alternatives for the proposed anesthesia with the patient or authorized representative who has indicated his/her understanding and acceptance.   Dental advisory given  Plan Discussed with: CRNA and Surgeon  Anesthesia Plan Comments:         Anesthesia Quick Evaluation

## 2016-01-31 NOTE — Anesthesia Postprocedure Evaluation (Signed)
Anesthesia Post Note  Patient: Leslie Osborn  Procedure(s) Performed: Procedure(s) (LRB): TRANSURETHRAL RESECTION OF BLADDER TUMOR (TURBT) (N/A)  Patient location during evaluation: PACU Anesthesia Type: General Level of consciousness: awake and alert Pain management: pain level controlled Vital Signs Assessment: post-procedure vital signs reviewed and stable Respiratory status: spontaneous breathing, nonlabored ventilation, respiratory function stable and patient connected to nasal cannula oxygen Cardiovascular status: blood pressure returned to baseline and stable Postop Assessment: no signs of nausea or vomiting Anesthetic complications: no    Last Vitals:  Vitals:   01/31/16 1847 01/31/16 1900  BP: (!) 190/91 (!) 178/55  Pulse: 61 (!) 53  Resp: 16   Temp: (!) 35.9 C     Last Pain:  Vitals:   01/31/16 1847  TempSrc: Tympanic                 Martha Clan

## 2016-01-31 NOTE — H&P (Signed)
Date of Initial H&P: 01/05/16  History reviewed, patient examined, no change in status, stable for surgery.

## 2016-01-31 NOTE — Progress Notes (Signed)
Catheter emptied if 50cc bloody urine before discharge  No complaints of pain

## 2016-01-31 NOTE — Op Note (Addendum)
Preoperative diagnosis: Bladder cancer  Postoperative diagnosis: Same   Procedure: 1. Transurethral resection of bladder tumor                      2. Instillation of mitomycin-C into the bladder  Surgeon: Otelia Limes. Yves Dill MD  Anesthesia: Gen.   Indications:See the history and physical. After informed consent the above procedure(s) were requested     Technique and findings: After adequate general anesthesia had been obtained the patient was placed into dorsal lithotomy position and the perineum was prepped and draped in the usual fashion. The resectoscope sheath was advanced into the bladder with the obturator in place. Resectoscope was coupled the camera and then placed into the sheath. Bladder was thoroughly inspected. Both ureteral orifices were identified and had clear reflux. A 20 mm x 30 mm tumor located at the anterior bladder wall near the bladder neck. The tumor appeared to be papillary. Her also several flat areas of tumor adjacent to the papillary tumor. At this point the tumor was resected and fragments submitted to pathology. The flat tumor was cauterized with the rollerball and the base of the papillary resection was cauterized with the rollerball. After all bleeders were controlled resectoscope was removed and an cc of viscous Xylocaine instilled within the bladder. A 20 French silicone catheter was placed and irrigated until clear. 40 milligrams of mitomycin-C was instilled within the bladder and the catheter was plugged. A B&O suppository was placed. The procedure was then terminated and patient transferred to the recovery room in stable condition.

## 2016-01-31 NOTE — Transfer of Care (Signed)
Immediate Anesthesia Transfer of Care Note  Patient: Leslie Osborn  Procedure(s) Performed: Procedure(s): TRANSURETHRAL RESECTION OF BLADDER TUMOR (TURBT) (N/A)  Patient Location: PACU  Anesthesia Type:General  Level of Consciousness: awake and alert   Airway & Oxygen Therapy: Patient connected to face mask oxygen  Post-op Assessment: Post -op Vital signs reviewed and stable  Post vital signs: stable  Last Vitals:  Vitals:   01/31/16 1403 01/31/16 1745  BP: (!) 186/75 (!) 181/72  Pulse: 74 65  Resp: 18 15  Temp: 36.9 C (!) 35.9 C    Last Pain:  Vitals:   01/31/16 1745  TempSrc: Temporal         Complications: No apparent anesthesia complications

## 2016-01-31 NOTE — Discharge Instructions (Addendum)
AMBULATORY SURGERY  DISCHARGE INSTRUCTIONS   1) The drugs that you were given will stay in your system until tomorrow so for the next 24 hours you should not:  A) Drive an automobile B) Make any legal decisions C) Drink any alcoholic beverage   2) You may resume regular meals tomorrow.  Today it is better to start with liquids and gradually work up to solid foods.  You may eat anything you prefer, but it is better to start with liquids, then soup and crackers, and gradually work up to solid foods.   3) Please notify your doctor immediately if you have any unusual bleeding, trouble breathing, redness and pain at the surgery site, drainage, fever, or pain not relieved by medication.    4) Additional Instructions:        Please contact your physician with any problems or Same Day Surgery at [phone], Monday through Friday 6 am to 4 pm, or [place] at [facility] number at [phone].  Bladder Cancer Bladder cancer is an abnormal growth of tissue in your bladder. Your bladder is the balloon-like sac in your pelvis. It collects and stores urine that comes from the kidneys through the ureters. The bladder wall is made of layers. If cancer spreads into these layers and through the wall of the bladder, it becomes more difficult to treat.  There are four stages of bladder cancer:  Stage I. Cancer at this stage occurs in the bladder's inner lining but has not invaded the muscular bladder wall.  Stage II. At this stage, cancer has invaded the bladder wall but is still confined to the bladder.  Stage III. By this stage, the cancer cells have spread through the bladder wall to surrounding tissue. They may also have spread to the prostate in men or the uterus or vagina in women.  Stage IV. By this stage, cancer cells may have spread to the lymph nodes and other organs, such as your lungs, bones, or liver. RISK FACTORS Although the cause of bladder cancer is not known, the  following risk factors can increase your chances of getting bladder cancer:   Smoking.   Occupational exposures, such as rubber, leather, textile, dyes, chemicals, and paint.  Being white.  Age.   Being female.   Having chronic bladder inflammation.   Having a bladder cancer history.   Having a family history of bladder cancer (heredity).   Having had chemotherapy or radiation therapy to the pelvis.   Being exposed to arsenic.  SYMPTOMS   Blood in the urine.   Pain with urination.   Frequent bladder or urine infections.  Increase in urgency and frequency of urination. DIAGNOSIS  Your health care provider may suspect bladder cancer based on your description of urinary symptoms or based on the finding of blood or infection in the urine (especially if this has recurred several times). Other tests or procedures that may be performed include:   A narrow tube being inserted into your bladder through your urethra (cystoscopy) in order to view the lining of your bladder for tumors.   A biopsy to sample the tumor to see if cancer is present.  If cancer is present, it will then be staged to determine its severity and extent. It is important to know how deeply into the bladder wall the cancer has grown and whether the cancer has spread to any other parts of your body. Staging may require blood tests or special scans such as a CT scan, MRI, bone  scan, or chest X-ray.  TREATMENT  Once your cancer has been diagnosed and staged, you should discuss a treatment plan with your health care provider. Based on the stage of the cancer, one treatment or a combination of treatments may be recommended. The most common forms of treatment are:   Surgery. Procedures that may be done include transurethral resection and cystectomy.  Radiation therapy. This is infrequently used to treat bladder cancer.   Chemotherapy. During this treatment, drugs are used to kill cancer  cells.  Immunotherapy. This is usually administered directly into the bladder. HOME CARE INSTRUCTIONS  Take medicines only as directed by your health care provider.   Maintain a healthy diet.   Consider joining a support group. This may help you learn to cope with the stress of having bladder cancer.   Seek advice to help you manage treatment side effects.   Keep all follow-up visits as directed by your health care provider.   Inform your cancer specialist if you are admitted to the hospital.  [place] IF:  There is blood in your urine.  You have symptoms of a urinary tract infection. These include:  Tiredness.  Shakiness.  Weakness.  Muscle aches.  Abdominal pain.  Frequent and intense urge to urinate (in young women).  Burning feeling in the bladder or urethra during urination (in young women). SEEK IMMEDIATE MEDICAL CARE IF:  You are unable to urinate.   This information is not intended to replace advice given to you by your health care provider. Make sure you discuss any questions you have with your health care provider.   Document Released: 06/14/2003 Document Revised: 07/02/2014 Document Reviewed: 12/02/2012 Elsevier Interactive Patient Education 2016 [employer]   Transurethral Resection, Bladder Tumor A cancerous growth (tumor) can develop on the inside wall of the bladder. The bladder is the organ that holds urine. One way to remove the tumor is a procedure called a transurethral resection. The tumor is removed (resected) through the tube that carries urine from the bladder out of the body (urethra). No cuts (incisions) are made in the skin. Instead, the procedure is done through a thin telescope, called a resectoscope. Attached to it is a light and usually a tiny camera. The resectoscope is put into the urethra. In men, the urethra opens at the end of the penis. In women, it opens just above the vagina.  A transurethral resection is usually  used to remove tumors that have not gotten too big or too deep. These are called Stage 0, Stage 1 or Stage 2 bladder cancers. LET YOUR CAREGIVER KNOW ABOUT:  On the day of the procedure, your caregivers will need to know the last time you had anything to eat or drink. This includes water, gum, and candy. In advance, make sure they know about:   Any allergies.  All medications you are taking, including:  Herbs, eyedrops, over-the-counter medications and creams.  Blood thinners (anticoagulants), aspirin or other drugs that could affect blood clotting.  Use of steroids (by mouth or as creams).  Previous problems with anesthetics, including local anesthetics.  Possibility of pregnancy, if this applies.  Any history of blood clots.  Any history of bleeding or other blood problems.  Previous surgery.  Smoking history.  Any recent symptoms of colds or infections.  Other health problems. RISKS AND COMPLICATIONS This is usually a safe procedure. Every procedure has risks, though. For a transurethral resection, they include:  Infection. Antibiotic medication would need to be taken.  Bleeding.  Light bleeding may last for several days after the procedure.  If bleeding continues or is heavy, the bladder may need rinsing. Or, a new catheter might be put in for awhile.  Sometimes bed rest is needed.  Urination problems.  Pain and burning can occur when urinating. This usually goes away in a few days.  Scarring from the procedure can block the flow of urine.  Bladder damage.  It can be punctured or torn during removal of the tumor. If this happens, a catheter might be needed for longer. Antibiotics would be taken while the bladder heals.  Urine can leak through the hole or tear into the abdomen. If this happens, surgery may be needed to repair the bladder. BEFORE THE PROCEDURE   A medical evaluation will be done. This may include:  A physical examination.  Urine test. This  is to make sure you do not have a urinary tract infection.  Blood tests.  A test that checks the heart's rhythm (electrocardiogram).  Talking with an anesthesiologist. This is the person who will be in charge of the medication (anesthesia) to keep you from feeling pain during the transurethral resection. You might be asleep during the procedure (general anesthesia) or numb from the waist down, but awake during the procedure (spinal anesthesia). Ask your surgeon what to expect.  The person who is having a transurethral resection needs to give what is called informed consent. This requires signing a legal paper that gives permission for the procedure. To give informed consent:  You must understand how the procedure is done and why.  You must be told all the risks and benefits of the procedure.  You must sign the consent. Sometimes a legal guardian can do this.  Signing should be witnessed by a healthcare professional.  The day before the surgery, eat only a light dinner. Then, do not eat or drink anything for at least 8 hours before the surgery. Ask your caregiver if it is OK to take any needed medicines with a sip of water.  Arrive at least an hour before the surgery or whenever your surgeon recommends. This will give you time to check in and fill out any needed paperwork. PROCEDURE  The preparation:  You will change into a hospital gown.  A needle will be inserted in your arm. This is an intravenous access tube (IV). Medication will be able to flow directly into your body through this needle.  Small monitors will be put on your body. They are used to check your heart, blood pressure, and oxygen level.  You might be given medication that will help you relax (sedative).  You will be given a general anesthetic or spinal anesthesia.  The procedure:  Once you are asleep or numb from the waist down, your legs will be placed in stirrups.  The resectoscope will be passed through the  urethra into the bladder.  Fluid will be passed through the resectoscope. This will fill the bladder with water.  The surgeon will examine the bladder through the scope. If the scope has a camera, it can take pictures from inside the bladder. They can be projected onto a TV screen.  The surgeon will use various tools to remove the tumor in small pieces. Sometimes a laser (a beam of light energy) is used. Other tools may use electric current.  A tube (catheter) will often be placed so that urine can drain into a bag outside the body. This process helps stop bleeding. This  tube keeps blood clots from blocking the urethra.  The procedure usually takes 30 to 45 minutes. AFTER THE PROCEDURE   You will stay in a recovery area until the anesthesia has worn off. Your blood pressure and pulse will be checked every so often. Then you will be taken to a hospital room.  You may continue to get fluids through the IV for awhile.  Some pain is normal. The catheter might be uncomfortable. Pain is usually not severe. If it is, ask for pain medicine.  Your urine may look bloody after a transurethral resection. This is normal.  If bleeding is heavy, a hospital caregiver may rinse out the bladder (irrigation) through the catheter.  Once the urine is clear, the catheter will be taken out.  You will need to stay in the hospital until you can urinate on your own.  Most people stay in the hospital for up to 4 days. PROGNOSIS   Transurethral resection is considered the best way to treat bladder tumors that are not too far along. For most people, the treatment is successful. Sometimes, though, more treatment is needed.  Bladder cancers can come back even after a successful procedure. Because of this, be sure to have a checkup with your caregiver every 3 to 6 months. If everything is OK for 3 years, you can reduce the checkups to once a year.   This information is not intended to replace advice given to you  by your health care provider. Make sure you discuss any questions you have with your health care provider.   Document Released: 04/07/2009 Document Revised: 09/03/2011 Document Reviewed: 06/13/2009 Elsevier Interactive Patient Education 2016 [place], Adult A Foley catheter is a soft, flexible tube. This tube is placed into your bladder to drain pee (urine). If you go home with this catheter in place, follow the instructions below. TAKING CARE OF THE CATHETER 1. Wash your hands with soap and water. 2. Put soap and water on a clean washcloth.  Clean the skin where the tube goes into your body.  Clean away from the tube site.  Never wipe toward the tube.  Clean the area using a circular motion.  Remove all the soap. Pat the area dry with a clean towel. For males, reposition the skin that covers the end of the penis (foreskin). 3. Attach the tube to your leg with tape or a leg strap. Do not stretch the tube tight. If you are using tape, remove any stickiness left behind by past tape you used. 4. Keep the drainage bag below your hips. Keep it off the floor. 5. Check your tube during the day. Make sure it is working and draining. Make sure the tube does not curl, twist, or bend. 6. Do not pull on the tube or try to take it out. TAKING CARE OF THE DRAINAGE BAGS You will have a large overnight drainage bag and a small leg bag. You may wear the overnight bag any time. Never wear the small bag at night. Follow the directions below. Emptying the Drainage Bag Empty your drainage bag when it is  - full or at least 2-3 times a day. 1. Wash your hands with soap and water. 2. Keep the drainage bag below your hips. 3. Hold the dirty bag over the toilet or clean container. 4. Open the pour spout at the bottom of the bag. Empty the pee into the toilet or container. Do not let the pour spout touch anything.  5. Clean the pour spout with a gauze pad or cotton ball that has  rubbing alcohol on it. 6. Close the pour spout. 7. Attach the bag to your leg with tape or a leg strap. 8. Wash your hands well. Changing the Drainage Bag Change your bag once a month or sooner if it starts to smell or look dirty.  1. Wash your hands with soap and water. 2. Pinch the rubber tube so that pee does not spill out. 3. Disconnect the catheter tube from the drainage tube at the connection valve. Do not let the tubes touch anything. 4. Clean the end of the catheter tube with an alcohol wipe. Clean the end of a the drainage tube with a different alcohol wipe. 5. Connect the catheter tube to the drainage tube of the clean drainage bag. 6. Attach the new bag to the leg with tape or a leg strap. Avoid attaching the new bag too tightly. 7. Wash your hands well. Cleaning the Drainage Bag 1. Wash your hands with soap and water. 2. Wash the bag in warm, soapy water. 3. Rinse the bag with warm water. 4. Fill the bag with a mixture of white vinegar and water (1 cup vinegar to 1 quart warm water [.2 liter vinegar to 1 liter warm water]). Close the bag and soak it for 30 minutes in the solution. 5. Rinse the bag with warm water. 6. Hang the bag to dry with the pour spout open and hanging downward. 7. Store the clean bag (once it is dry) in a clean plastic bag. 8. Wash your hands well. PREVENT INFECTION  Wash your hands before and after touching your tube.  Take showers every day. Wash the skin where the tube enters your body. Do not take baths. Replace wet leg straps with dry ones, if this applies.  Do not use powders, sprays, or lotions on the genital area. Only use creams, lotions, or ointments as told by your doctor.  For females, wipe from front to back after going to the bathroom.  Drink enough fluids to keep your pee clear or pale yellow unless you are told not to have too much fluid (fluid restriction).  Do not let the drainage bag or tubing touch or lie on the floor.  Wear  cotton underwear to keep the area dry. GET HELP IF:  Your pee is cloudy or smells unusually bad.  Your tube becomes clogged.  You are not draining pee into the bag or your bladder feels full.  Your tube starts to leak. GET HELP RIGHT AWAY IF:  You have pain, puffiness (swelling), redness, or yellowish-white fluid (pus) where the tube enters the body.  You have pain in the belly (abdomen), legs, lower back, or bladder.  You have a fever.  You see blood fill the tube, or your pee is pink or red.  You feel sick to your stomach (nauseous), throw up (vomit), or have chills.  Your tube gets pulled out. MAKE SURE YOU:   Understand these instructions.  Will watch your condition.  Will get help right away if you are not doing well or get worse.   This information is not intended to replace advice given to you by your health care provider. Make sure you discuss any questions you have with your health care provider.   Document Released: 10/06/2012 Document Revised: 07/02/2014 Document Reviewed: 10/06/2012 Elsevier Interactive Patient Education [employer].

## 2016-02-01 ENCOUNTER — Encounter: Payer: Self-pay | Admitting: Urology

## 2016-02-02 LAB — SURGICAL PATHOLOGY

## 2016-10-22 ENCOUNTER — Other Ambulatory Visit: Payer: Self-pay

## 2016-10-22 DIAGNOSIS — D0511 Intraductal carcinoma in situ of right breast: Secondary | ICD-10-CM

## 2016-12-10 ENCOUNTER — Telehealth: Payer: Self-pay | Admitting: *Deleted

## 2016-12-10 NOTE — Telephone Encounter (Signed)
Patient called and wanted to let you know that she wants to discontinued all mammogram and follow up appointments due to decrease in health.

## 2016-12-25 ENCOUNTER — Other Ambulatory Visit: Payer: Medicare Other

## 2017-01-03 ENCOUNTER — Ambulatory Visit: Payer: Medicare Other | Admitting: General Surgery

## 2017-02-02 ENCOUNTER — Emergency Department: Payer: Medicare Other

## 2017-02-02 ENCOUNTER — Emergency Department
Admission: EM | Admit: 2017-02-02 | Discharge: 2017-02-02 | Disposition: A | Discharge status: Home / Self Care | Payer: Medicare Other | Attending: Emergency Medicine | Admitting: Emergency Medicine

## 2017-02-02 DIAGNOSIS — E039 Hypothyroidism, unspecified: Secondary | ICD-10-CM | POA: Insufficient documentation

## 2017-02-02 DIAGNOSIS — Z8541 Personal history of malignant neoplasm of cervix uteri: Secondary | ICD-10-CM | POA: Insufficient documentation

## 2017-02-02 DIAGNOSIS — Z87891 Personal history of nicotine dependence: Secondary | ICD-10-CM | POA: Diagnosis not present

## 2017-02-02 DIAGNOSIS — I4891 Unspecified atrial fibrillation: Secondary | ICD-10-CM | POA: Diagnosis not present

## 2017-02-02 DIAGNOSIS — Z8500 Personal history of malignant neoplasm of unspecified digestive organ: Secondary | ICD-10-CM | POA: Insufficient documentation

## 2017-02-02 DIAGNOSIS — G309 Alzheimer's disease, unspecified: Secondary | ICD-10-CM | POA: Diagnosis not present

## 2017-02-02 DIAGNOSIS — I10 Essential (primary) hypertension: Secondary | ICD-10-CM | POA: Diagnosis not present

## 2017-02-02 DIAGNOSIS — Z853 Personal history of malignant neoplasm of breast: Secondary | ICD-10-CM | POA: Diagnosis not present

## 2017-02-02 DIAGNOSIS — Z79899 Other long term (current) drug therapy: Secondary | ICD-10-CM | POA: Insufficient documentation

## 2017-02-02 LAB — CBC WITH DIFFERENTIAL/PLATELET
Basophils Absolute: 0 10*3/uL (ref 0–0.1)
Basophils Relative: 1 %
EOS ABS: 0.1 10*3/uL (ref 0–0.7)
EOS PCT: 2 %
HCT: 44.7 % (ref 35.0–47.0)
Hemoglobin: 14.6 g/dL (ref 12.0–16.0)
LYMPHS ABS: 1.4 10*3/uL (ref 1.0–3.6)
Lymphocytes Relative: 24 %
MCH: 29.3 pg (ref 26.0–34.0)
MCHC: 32.7 g/dL (ref 32.0–36.0)
MCV: 89.6 fL (ref 80.0–100.0)
Monocytes Absolute: 0.7 10*3/uL (ref 0.2–0.9)
Monocytes Relative: 11 %
Neutro Abs: 3.6 10*3/uL (ref 1.4–6.5)
Neutrophils Relative %: 62 %
PLATELETS: 161 10*3/uL (ref 150–440)
RBC: 4.99 MIL/uL (ref 3.80–5.20)
RDW: 16.7 % — AB (ref 11.5–14.5)
WBC: 5.8 10*3/uL (ref 3.6–11.0)

## 2017-02-02 LAB — BRAIN NATRIURETIC PEPTIDE: B NATRIURETIC PEPTIDE 5: 401 pg/mL — AB (ref 0.0–100.0)

## 2017-02-02 LAB — TROPONIN I: Troponin I: <0.03 ng/mL (ref <0.03)

## 2017-02-02 LAB — BASIC METABOLIC PANEL
Anion gap: 8 (ref 5–15)
BUN: 12 mg/dL (ref 6–20)
CALCIUM: 9.3 mg/dL (ref 8.9–10.3)
CO2: 30 mmol/L (ref 22–32)
Chloride: 102 mmol/L (ref 101–111)
Creatinine, Ser: 0.68 mg/dL (ref 0.44–1.00)
GFR calc Af Amer: >60 mL/min (ref >60)
GLUCOSE: 106 mg/dL — AB (ref 65–99)
Potassium: 3.7 mmol/L (ref 3.5–5.1)
Sodium: 140 mmol/L (ref 135–145)

## 2017-02-02 LAB — HEPATIC FUNCTION PANEL
ALT: 11 U/L — AB (ref 14–54)
AST: 21 U/L (ref 15–41)
Albumin: 3.8 g/dL (ref 3.5–5.0)
Alkaline Phosphatase: 78 U/L (ref 38–126)
BILIRUBIN DIRECT: 0.2 mg/dL (ref 0.1–0.5)
BILIRUBIN INDIRECT: 0.6 mg/dL (ref 0.3–0.9)
Total Bilirubin: 0.8 mg/dL (ref 0.3–1.2)
Total Protein: 6.9 g/dL (ref 6.5–8.1)

## 2017-02-02 LAB — T4, FREE: Free T4: 0.74 ng/dL (ref 0.61–1.12)

## 2017-02-02 LAB — TSH: TSH: 12.987 u[IU]/mL — ABNORMAL HIGH (ref 0.350–4.500)

## 2017-02-02 MED ORDER — LISINOPRIL 20 MG PO TABS: 40.0000 mg | ORAL_TABLET | Freq: Two times a day (BID) | ORAL | 0 refills | Status: DC

## 2017-02-02 MED ORDER — LISINOPRIL 10 MG PO TABS
40.0000 mg | ORAL_TABLET | Freq: Once | ORAL | Status: AC
  Administered 2017-02-02: 40 mg via ORAL
  Filled 2017-02-02: qty 4

## 2017-02-02 NOTE — ED Provider Notes (Signed)
[facility] Emergency Department Provider Note  ____________________________________________   First MD Initiated Contact with Patient 02/02/17 1548     (approximate)  I have reviewed the triage vital signs and the nursing notes.   HISTORY  Chief Complaint Hypertension   HPI Leslie Osborn is a 83 y.o. female who sent to the emergency department from her nursing home for asymptomatic hypertension. She has a long-standing history of hypertension for which she takes atenolol and lisinopril. Nursing home today noted her blood pressure was 220/100 so they called her primary care physician who advised that she should take an extra dose of atenolol however they could not take verbal orders so they called 911. The patient herself has no complaints. She denies chest pain shortness of breath abdominal pain nausea vomiting headache double vision or blurred vision numbness or weakness. She has never had any heart issues and she has never seen a cardiologist before.   Past Medical History:  Diagnosis Date  . Alzheimer disease   . Bladder cancer ([employer]) 2009  . Cancer of breast [facility]) 2011   R-Breast DCIS, ER/PR negative  . Carcinoma in situ of breast 2011   R-breast  . Cataract extraction status of left eye   . Cataract extraction status of right eye   . Cervical cancer ([place]) ?  Marland Kitchen Colon cancer ([place]) 1992   chemo  . Family history of adverse reaction to anesthesia    sister got nauseated after surgery many years ago.  Marland Kitchen Hypertension 1991  . Hypothyroidism   . Macular degeneration 2012  . Mammographic microcalcification   . Restless legs syndrome     Patient Active Problem List   Diagnosis Date Noted  . Personal history of breast cancer 12/11/2012    Past Surgical History:  Procedure Laterality Date  . ABDOMINAL HYSTERECTOMY  1972  . APPENDECTOMY    . BLADDER SURGERY  2009  . BREAST BIOPSY  1960  . BREAST BIOPSY Right 2011   positive- radiation  .  BREAST CYST EXCISION Right   . BREAST SURGERY Right 11/15/09   lumpectomy  . COLON SURGERY  1992   resection, chemo  . COLONOSCOPY  2007  . CYSTOSCOPY  2010  . EYE SURGERY  2000, 2010   cataract  . mammosite balloon placement  2011   Removed in 2011  . POLYPECTOMY  2003  . TRANSURETHRAL RESECTION OF BLADDER TUMOR N/A 01/31/2016   Procedure: TRANSURETHRAL RESECTION OF BLADDER TUMOR (TURBT);  Surgeon: Royston Cowper, MD;  Location: [facility] ORS;  Service: Urology;  Laterality: N/A;    Prior to Admission medications   Medication Sig Start Date End Date Taking? Authorizing Provider  acetaminophen-codeine (TYLENOL #3) 300-30 MG tablet Take 1-2 tablets by mouth every 4 (four) hours as needed for moderate pain. 01/31/16   Royston Cowper, MD  Ascorbic Acid (VITAMIN C WITH ROSE HIPS) 500 MG tablet Take 500 mg by mouth daily.    [provider]  Bevacizumab (AVASTIN) 100 MG/4ML SOLN Last appt 01/10/16, Next appt Oct 2017    [provider]  Calcium Carb-Cholecalciferol (CALCIUM 500+D3) 500-400 MG-UNIT TABS Take 1 tablet by mouth 2 (two) times daily.    [provider]  diphenhydrAMINE (BENADRYL) 25 mg capsule Take 25 mg by mouth every 6 (six) hours as needed for allergies.    [provider]  docusate sodium (COLACE) 100 MG capsule Take 2 capsules (200 mg total) by mouth 2 (two) times daily. 01/31/16  Royston Cowper, MD  folic acid (FOLVITE) 660 MCG tablet Take 800 mcg by mouth daily.     [provider]  galantamine (RAZADYNE) 8 MG tablet Take 8 mg by mouth 2 (two) times daily.    [provider]  Hypromell-Glycerin-Phenyleph (CLEAR EYES PURE RELIEF MS PF OP) Place 1 drop into both eyes daily as needed.    [provider]  levofloxacin (LEVAQUIN) 500 MG tablet Take 1 tablet (500 mg total) by mouth daily. 01/31/16   Royston Cowper, MD  levothyroxine (SYNTHROID, LEVOTHROID) 100 MCG tablet Take 100 mcg by mouth See admin instructions. Pt takes  daily in the morning before breakfast Sunday through Friday. Pt does not take med on Saturday    [provider]  lisinopril (PRINIVIL,ZESTRIL) 20 MG tablet Take 2 tablets (40 mg total) by mouth 2 (two) times daily. 02/02/17 03/04/17  Darel Hong, MD  magnesium oxide (MAG-OX) 400 MG tablet Take 400 mg by mouth daily.    [provider]  Methen-Hyosc-Meth Blue-Na Phos (UROGESIC-BLUE) 81.6 MG TABS Take 1 tablet (81.6 mg total) by mouth every 6 (six) hours as needed. 01/31/16   Royston Cowper, MD  Multiple Vitamins-Minerals (OCUTABS) TABS Take 2 tablets by mouth daily.    [provider]  rOPINIRole (REQUIP) 1 MG tablet Take 1 mg by mouth at bedtime. Take 1 hour before bedtime    [provider]  simvastatin (ZOCOR) 20 MG tablet Take 20 mg by mouth every evening.    [provider]  vitamin B-12 (CYANOCOBALAMIN) 500 MCG tablet Take 500 mcg by mouth daily.    [provider]    Allergies Donepezil hcl  No family history on file.  Social History Social History  Substance Use Topics  . Smoking status: Former Smoker    Quit date: 01/26/2006  . Smokeless tobacco: Never Used  . Alcohol use No    Review of Systems Constitutional: No fever/chills Eyes: No visual changes. ENT: No sore throat. Cardiovascular: Denies chest pain. Respiratory: Denies shortness of breath. Gastrointestinal: No abdominal pain.  No nausea, no vomiting.  No diarrhea.  No constipation. Genitourinary: Negative for dysuria. Musculoskeletal: Negative for back pain. Skin: Negative for rash. Neurological: Negative for headaches, focal weakness or numbness.   ____________________________________________   PHYSICAL EXAM:  VITAL SIGNS: ED Triage Vitals  Enc Vitals Group     BP 02/02/17 1549 (!) 158/115     Pulse Rate 02/02/17 1549 (!) 55     Resp 02/02/17 1549 20     Temp 02/02/17 1549 98 F (36.7 C)     Temp Source 02/02/17 1549 Oral     SpO2 02/02/17 1549  97 %     Weight 02/02/17 1554 182 lb (82.6 kg)     Height 02/02/17 1554 5\' 4"  (1.626 m)     Head Circumference --      Peak Flow --      Pain Score --      Pain Loc --      Pain Edu? --      Excl. in [employer]? --     Constitutional: Pleasant cooperative speaks in full clear sentences alert and oriented 4 Eyes: PERRL EOMI. Head: Atraumatic. Nose: No congestion/rhinnorhea. Mouth/Throat: No trismus Neck: No stridor.   Cardiovascular: Irregularly irregular and bradycardic normal heart sounds Respiratory: Normal respiratory effort.  No retractions. Lungs CTAB and moving good air Gastrointestinal: Soft nontender Musculoskeletal: No lower extremity edema   Neurologic:  Normal speech and  language. No gross focal neurologic deficits are appreciated. Skin:  Skin is warm, dry and intact. No rash noted. Psychiatric: Mild dementia    ____________________________________________   DIFFERENTIAL includes but not limited to  Atrial fibrillation, acute coronary syndrome, renal failure, asymptomatic hypertension ____________________________________________   LABS (all labs ordered are listed, but only abnormal results are displayed)  Labs Reviewed  BASIC METABOLIC PANEL - Abnormal; Notable for the following:       Result Value   Glucose, Bld 106 (*)    All other components within normal limits  HEPATIC FUNCTION PANEL - Abnormal; Notable for the following:    ALT 11 (*)    All other components within normal limits  BRAIN NATRIURETIC PEPTIDE - Abnormal; Notable for the following:    B Natriuretic Peptide 401.0 (*)    All other components within normal limits  CBC WITH DIFFERENTIAL/PLATELET - Abnormal; Notable for the following:    RDW 16.7 (*)    All other components within normal limits  TSH - Abnormal; Notable for the following:    TSH 12.987 (*)    All other components within normal limits  TROPONIN I  T4, FREE    No signs of acute ischemia elevated TSH normal free  T4 __________________________________________  EKG  ED ECG REPORT I, Darel Hong, the attending physician, personally viewed and interpreted this ECG.  Date: 02/02/2017 Rate: 42 Rhythm: Atrial fibrillation and bradycardic QRS Axis: normal Intervals: normal ST/T Wave abnormalities: normal Narrative Interpretation: Abnormal  ____________________________________________  RADIOLOGY  Chest x-ray with no acute disease ____________________________________________   PROCEDURES  Procedure(s) performed: no  Procedures  Critical Care performed: no  Observation: no ____________________________________________   INITIAL IMPRESSION / ASSESSMENT AND PLAN / ED COURSE  Pertinent labs & imaging results that were available during my care of the patient were reviewed by me and considered in my medical decision making (see chart for details).  The patient is asymptomatic and very well-appearing. On the monitor I did notice that she is bradycardic with an irregular rhythms rechecked an EKG which appears to show that she is in new onset atrial fibrillation however bradycardic.     ----------------------------------------- 5:48 PM on 02/02/2017 -----------------------------------------  I discussed the case with on call cardiologist Dr. Ubaldo Glassing who recommends discontinuation of atenolol, increasing lisinopril to 20mg  BID, no asprin, no anticoagulation, and follow up in clinic in one week. The patient verbally understands and agrees the plan. ____________________________________________   FINAL CLINICAL IMPRESSION(S) / ED DIAGNOSES  Final diagnoses:  Atrial fibrillation, unspecified type ([place])  Hypertension, unspecified type      NEW MEDICATIONS STARTED DURING THIS VISIT:  Discharge Medication List as of 02/02/2017  7:25 PM       Note:  This document was prepared using Dragon voice recognition software and may include unintentional dictation errors.     Darel Hong,  MD 02/03/17 1536

## 2017-02-02 NOTE — ED Notes (Signed)
This RN called [facility] to give them a heads up on her medication change,

## 2017-02-02 NOTE — Discharge Instructions (Signed)
PLEASE STOP TAKING ATENOLOL  Increase your dose of lisinopril to 40 mg by mouth twice a day and make an appointment to follow up with cardiology Dr. Ubaldo Glassing this coming week.  Return to the ED for any concerns.  It was a pleasure to take care of you today, and thank you for coming to our emergency department.  If you have any questions or concerns before leaving please ask the nurse to grab me and I'm more than happy to go through your aftercare instructions again.  If you were prescribed any opioid pain medication today such as Norco, Vicodin, Percocet, morphine, hydrocodone, or oxycodone please make sure you do not drive when you are taking this medication as it can alter your ability to drive safely.  If you have any concerns once you are home that you are not improving or are in fact getting worse before you can make it to your follow-up appointment, please do not hesitate to call 911 and come back for further evaluation.  Darel Hong, MD  Results for orders placed or performed during the hospital encounter of 17/40/81  Basic metabolic panel  Result Value Ref Range   Sodium 140 135 - 145 mmol/L   Potassium 3.7 3.5 - 5.1 mmol/L   Chloride 102 101 - 111 mmol/L   CO2 30 22 - 32 mmol/L   Glucose, Bld 106 (H) 65 - 99 mg/dL   BUN 12 6 - 20 mg/dL   Creatinine, Ser 0.68 0.44 - 1.00 mg/dL   Calcium 9.3 8.9 - 10.3 mg/dL   GFR calc non Af Amer >60 >60 mL/min   GFR calc Af Amer >60 >60 mL/min   Anion gap 8 5 - 15  Hepatic function panel  Result Value Ref Range   Total Protein 6.9 6.5 - 8.1 g/dL   Albumin 3.8 3.5 - 5.0 g/dL   AST 21 15 - 41 U/L   ALT 11 (L) 14 - 54 U/L   Alkaline Phosphatase 78 38 - 126 U/L   Total Bilirubin 0.8 0.3 - 1.2 mg/dL   Bilirubin, Direct 0.2 0.1 - 0.5 mg/dL   Indirect Bilirubin 0.6 0.3 - 0.9 mg/dL  Troponin I  Result Value Ref Range   Troponin I <0.03 <0.03 ng/mL  Brain natriuretic peptide  Result Value Ref Range   B Natriuretic Peptide 401.0 (H) 0.0 - 100.0  pg/mL  CBC with Differential  Result Value Ref Range   WBC 5.8 3.6 - 11.0 K/uL   RBC 4.99 3.80 - 5.20 MIL/uL   Hemoglobin 14.6 12.0 - 16.0 g/dL   HCT 44.7 35.0 - 47.0 %   MCV 89.6 80.0 - 100.0 fL   MCH 29.3 26.0 - 34.0 pg   MCHC 32.7 32.0 - 36.0 g/dL   RDW 16.7 (H) 11.5 - 14.5 %   Platelets 161 150 - 440 K/uL   Neutrophils Relative % 62 %   Neutro Abs 3.6 1.4 - 6.5 K/uL   Lymphocytes Relative 24 %   Lymphs Abs 1.4 1.0 - 3.6 K/uL   Monocytes Relative 11 %   Monocytes Absolute 0.7 0.2 - 0.9 K/uL   Eosinophils Relative 2 %   Eosinophils Absolute 0.1 0 - 0.7 K/uL   Basophils Relative 1 %   Basophils Absolute 0.0 0 - 0.1 K/uL   Dg Chest Port 1 View  Result Date: 02/02/2017 CLINICAL DATA:  New onset atrial fibrillation, history hypertension, bladder cancer, RIGHT breast cancer, colon cancer, former smoker EXAM: PORTABLE CHEST 1 VIEW  COMPARISON:  Portable exam 1624 hours compared to 04/20/2006 FINDINGS: Enlargement of cardiac silhouette. Mediastinal contours and pulmonary vascularity normal. Atherosclerotic calcification aorta. Lungs clear. No pleural effusion or pneumothorax. Bones demineralized. IMPRESSION: Enlargement of cardiac silhouette without acute abnormality. Electronically Signed   By: Lavonia Dana M.D.   On: 02/02/2017 17:09

## 2017-02-02 NOTE — ED Triage Notes (Signed)
Patient here from [facility] living via [facility] with high blood pressure. Per EMS sister called PCP Dr Hall Busing about blood pressure and gave a verbal order for an extra 1/2 of atenolol but the facility cannot take verbal orders. Patient is alert and oriented at this time. Hx of dementia. Patient denies any complaints.

## 2017-02-02 NOTE — ED Notes (Signed)
Patient verbalizes understanding of d/c instructions and follow-up. VS stable and pain controlled per patient.  Patient in NAD at time of d/c and denies further concerns regarding this visit. Patient stable at the time of departure from the unit, departing unit by the safest and most appropriate manner per that patients condition and limitations. Patient advised to return to the ED at any time for emergent concerns, or for new/worsening symptoms.   

## 2017-02-19 ENCOUNTER — Telehealth: Payer: Self-pay | Admitting: General Surgery

## 2017-02-19 NOTE — Telephone Encounter (Signed)
BILLIE BLAND(PT'S SISTER)CALLED BACK AND STATED SHE HAD SPOKE TO Jaedan Hulon.SHE DOESN'T WANT TO COME BACK FOR CARE.

## 2017-02-19 NOTE — Telephone Encounter (Signed)
PATIENT'S SISTER BILLIE BLAND CALLED & LEFT A MESSAGE WITH OUR ANSWERING SERVICE SHE WANTED TO MAKE AN APPOINTMENT FOR PATIENT.I CALLED [phone](NUMBER SHE LEFT WITH SERVICE)LEFT MESSSAGEFOR HER TO CALL BACK .

## 2018-08-20 ENCOUNTER — Other Ambulatory Visit: Payer: Self-pay | Admitting: Internal Medicine

## 2018-08-20 ENCOUNTER — Ambulatory Visit
Admission: RE | Admit: 2018-08-20 | Discharge: 2018-08-20 | Disposition: A | Discharge status: Home / Self Care | Payer: Medicare Other | Source: Ambulatory Visit | Attending: Internal Medicine | Admitting: Internal Medicine

## 2018-08-20 ENCOUNTER — Ambulatory Visit
Admission: RE | Admit: 2018-08-20 | Discharge: 2018-08-20 | Disposition: A | Discharge status: Home / Self Care | Payer: Medicare Other | Attending: Internal Medicine | Admitting: Internal Medicine

## 2018-08-20 DIAGNOSIS — R062 Wheezing: Secondary | ICD-10-CM | POA: Insufficient documentation

## 2018-08-20 DIAGNOSIS — R059 Cough, unspecified: Secondary | ICD-10-CM

## 2018-08-20 DIAGNOSIS — R05 Cough: Secondary | ICD-10-CM

## 2019-11-18 NOTE — Progress Notes (Signed)
 PCP: Dr. Corlis, Leslie BROCKS, MD  Assessment and Plan:   In most patients we give written parts of assessment and plan to patient under Patient Instructions/After Visit Summary. So some parts are directed to patient.  Dear [name], It was our pleasure to participate in your care in person. We have typed up brief summary of what we discussed.  1. Alzheimer's Disease with behavioral disturbances - progressive  - Continue galantamine  12 mg two times a day.   We discussed with patient and patient's sister regarding patient's behavior at assistive living facility. We discussed patient being kept separate from resident she has been having issues with, possibly moving patient's room away from other resident.   We discussed sundowning with patient's sister - advised family to go spend time with patient around the evening to see if patient is having sundowning episodes.   We discussed medication Seroquel  but will hold off at this time.   2. Hypertension - Continue following with primary care physician and cardiology for management. - Continue Atenolol 50 mg once a day. - Continue Lisinopril  40 mg two times a day.  3. Restless Leg Syndrome - stable   4. Chronic Insomnia - stable. - Continue Melatonin 3 mg once a day at night.   5. Left lower back pain   Return in 6 months (on 05/20/2020) for memory follow up, with Leslie Shorts, PA.  Interim History date 11/18/2019   [name] is a 86 y.o. female here for treatment and evaluation of Alzheimer's Disease, accompanied by her sister/POA.  [name] last visit was on 06/16/2019  Patient's sister states her memory is worse. Sister states her short term memory is getting shorter and she has been verbally aggressive towards staff and one resident at [facility] facility. Sister has brought notes today from [facility] stating their concerns. Sister is unsure who is right or wrong in this matter and she can only go off what was reported by the staff  at the facility. Per sister she does not like anyone to go in her room and she does not go in anyone else's room and she mage it clear to the facility. She is currently taking Galantamine  12 mg twice daily and is doing well, but the facility is wondering she should be on another medication that could help her memory or increase the Galantamine .   Patient states her restless legs are stable. She say that she has them sometimes, but nothing to complain about.   [name] mentioned that she is taking her medications as prescribed.  Disease Summary: (Aggregate of information from previous visits)   [name] is a right handed 87 y.o. female   Alzheimer's Disease: Patient states that she noticed memory issues since 2013 a year ago. Symptoms came on gradually. She forgets names and conversations. She asks same questions over. Checks things over and over. At the time of initial evaluation Patient was still driving. No problems with wandering. No issues with personality, she was able to groom, bathe herself. She lives alone takes her medications on her own. She does not get confused between day and night. She does not take care of her finances due to vision issues.Primary caregiver is her sister. She was given medication to help with memory but had a reaction to it.She has not had any imaging done for this problem. Patient used to be very meticulous person but now she is pack rat, she does lots of  hoarding specifically of office supplies. Patient does  not cook by herself.  And eats out regular basis. Her sister pays her bills (late 2017). She states she was not making any mistakes she just could not see how to do her checkbook.  SLUMS: 17/30 (02/2014) Donepezil: (She had reaction to medication, she threw up) galantamine   Insomnia: Patient says she has always had an issue with sleeping. Medication: melatonin  Macular Degeneration: Sees Dr Imogene Morita, history of Catarac surgery.   Restless  legs: are keeping her up at night. Patient feels like she needs to move her legs.  Patient states her legs move at night.  She has good nights and bad night.    Medication: Ropinirole  Patient also has bladder cancer. Chemo and radiation was recommended but patient declined.    History of hypertension: blood pressure checked once a week at least by care facility Medication: Atenolol, Lisinopril   General Exam:   Vitals:   11/18/19 1141  BP: 140/60  Weight: 72.8 kg (160 lb 9.6 oz)  Height: 165.1 cm (5' 5)   Body mass index is 26.73 kg/m.  General Exam Canals are patent bilateral, no erythema  Neurological Exam  Date recall (06/16/2019) - unable to recall year,       Alert, She has a significantly impaired visual acuity due to her macular degeneration.  Significantly hard time reading in central vision.   Intact peripheral vision. Decreased hearing on right compared to left on finger rub  Mild generalized age appropriate loss of muscle mass but strength seems 5/5, no focal atrophy, fasciculations seen.    Deep tendon reflexes 1  Patient has a decreased sensation to light touch. Patient's gait is slow and unsteady, mild antalgic gait. Using a cane today  Medications: Current Outpatient Medications on File Prior to Visit  Medication Sig Dispense Refill  . acetaminophen  (TYLENOL ) 500 MG tablet Take by mouth continuously as needed for Pain.    SABRA atenolol (TENORMIN) 50 MG tablet Take 1 tablet (50 mg total) by mouth once daily. 30 tablet 11  . beta-carotene,A,-vits C,E/mins (OCUTABS ORAL) Take by mouth.    . hydroCHLOROthiazide  (HYDRODIURIL ) 12.5 MG tablet Take 1 tablet by mouth once daily    . lanolin/mineral oil/petrolatum (ARTIFICIAL TEARS OPHTH) Apply to eye 1.4% one drop each eye every 4 hours PRN    . levothyroxine  (SYNTHROID ) 50 MCG tablet Take 50 mcg by mouth every morning TAKE 1 TABLET BY MOUTH EVERY MORNING    . lisinopriL  (ZESTRIL ) 40 MG tablet Take 40 mg by mouth 2  (two) times daily TAKE 1 TABLET BY MOUTH TWICE A DAY    . melatonin 3 mg tablet Take by mouth    . simvastatin  (ZOCOR ) 20 MG tablet Take 1 tablet by mouth nightly.    . galantamine  (RAZADYNE ) 12 MG tablet Take 1 tablet (12 mg total) by mouth 2 (two) times daily with meals for 90 days 180 tablet 3   No current facility-administered medications on file prior to visit.   Past Medical History:  Past Medical History:  Diagnosis Date  . Breast cancer (CMS-HCC)    right  . Dementia (CMS-HCC)   . History of bladder cancer   . History of heart disease   . History of lung disease   . Hypertension   . Restless leg syndrome   . Thyroid  disease     Past Surgical History:  Past Surgical History:  Procedure Laterality Date  . ABDOMINAL HYSTERECTOMY    . Cataract surgery Bilateral   . CYSTOSCOPY Right  2010  . MASTECTOMY PARTIAL     Family History:  Family History  Problem Relation Age of Onset  . Colon cancer Brother    Social History:  Social History   Socioeconomic History  . Marital status: Widowed    Spouse name: Not on file  . Number of children: Not on file  . Years of education: Not on file  . Highest education level: Not on file  Occupational History  . Not on file  Tobacco Use  . Smoking status: Never Smoker  . Smokeless tobacco: Never Used  Vaping Use  . Vaping Use: Never used  Substance and Sexual Activity  . Alcohol use: No  . Drug use: No  . Sexual activity: Defer  Other Topics Concern  . Not on file  Social History Narrative   Education:not listed   Occupation: Retired   Hobbies: Nonelisted   Marital Status: single   Social Determinants of Corporate investment banker Strain:   . Difficulty of Paying Living Expenses:   Food Insecurity:   . Worried About Programme researcher, broadcasting/film/video in the Last Year:   . Barista in the Last Year:   Transportation Needs:   . Freight forwarder (Medical):   SABRA Lack of Transportation (Non-Medical):   Physical  Activity:   . Days of Exercise per Week:   . Minutes of Exercise per Session:   Stress:   . Feeling of Stress :   Social Connections:   . Frequency of Communication with Friends and Family:   . Frequency of Social Gatherings with Friends and Family:   . Attends Religious Services:   . Active Member of Clubs or Organizations:   . Attends Banker Meetings:   SABRA Marital Status:    Allergies:  Allergies  Allergen Reactions  . Donepezil Hcl Diarrhea and Nausea And Vomiting   This note is partially written by Alan Bunker, CMA and Alm Miser in the presence of and acting as the scribe of Dr. Jannett Osborn, who has reviewed, edited and added to the note to reflect his best personal medical judgment. Payor: [facility] MEDICARE ADVANTAGE PLAN / Plan: [facility] MEDICARE ADVANTAGE / Product Type: Medicare /  This note was generated in part with voice recognition software and I apologize for any typographical errors that were not detected and corrected.     Dr. Jannett MARLA Fairly, MD Board Certified in Neurology and Clinical Neurophysiology [facility]

## 2021-01-16 ENCOUNTER — Emergency Department: Payer: Medicare Other

## 2021-01-16 ENCOUNTER — Other Ambulatory Visit: Payer: Self-pay

## 2021-01-16 ENCOUNTER — Emergency Department
Admission: EM | Admit: 2021-01-16 | Discharge: 2021-01-16 | Disposition: A | Discharge status: Home / Self Care | Payer: Medicare Other | Attending: Emergency Medicine | Admitting: Emergency Medicine

## 2021-01-16 DIAGNOSIS — S0990XA Unspecified injury of head, initial encounter: Secondary | ICD-10-CM

## 2021-01-16 DIAGNOSIS — M25511 Pain in right shoulder: Secondary | ICD-10-CM | POA: Diagnosis not present

## 2021-01-16 DIAGNOSIS — Z853 Personal history of malignant neoplasm of breast: Secondary | ICD-10-CM | POA: Diagnosis not present

## 2021-01-16 DIAGNOSIS — G309 Alzheimer's disease, unspecified: Secondary | ICD-10-CM | POA: Diagnosis not present

## 2021-01-16 DIAGNOSIS — I10 Essential (primary) hypertension: Secondary | ICD-10-CM | POA: Diagnosis not present

## 2021-01-16 DIAGNOSIS — S0083XA Contusion of other part of head, initial encounter: Secondary | ICD-10-CM | POA: Insufficient documentation

## 2021-01-16 DIAGNOSIS — M79631 Pain in right forearm: Secondary | ICD-10-CM | POA: Diagnosis not present

## 2021-01-16 DIAGNOSIS — E039 Hypothyroidism, unspecified: Secondary | ICD-10-CM | POA: Insufficient documentation

## 2021-01-16 DIAGNOSIS — Z8551 Personal history of malignant neoplasm of bladder: Secondary | ICD-10-CM | POA: Diagnosis not present

## 2021-01-16 DIAGNOSIS — Z85038 Personal history of other malignant neoplasm of large intestine: Secondary | ICD-10-CM | POA: Insufficient documentation

## 2021-01-16 DIAGNOSIS — Z79899 Other long term (current) drug therapy: Secondary | ICD-10-CM | POA: Insufficient documentation

## 2021-01-16 DIAGNOSIS — Z8541 Personal history of malignant neoplasm of cervix uteri: Secondary | ICD-10-CM | POA: Insufficient documentation

## 2021-01-16 DIAGNOSIS — W19XXXA Unspecified fall, initial encounter: Secondary | ICD-10-CM

## 2021-01-16 DIAGNOSIS — Z87891 Personal history of nicotine dependence: Secondary | ICD-10-CM | POA: Insufficient documentation
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
## 2021-01-16 NOTE — ED Provider Notes (Signed)
[facility] Emergency Department Provider Note  Time seen: 1:37 PM  I have reviewed the triage vital signs and the nursing notes.   HISTORY  Chief Complaint Fall   HPI Leslie Osborn is a 87 y.o. female with a past medical history of dementia, hypertension, presents to the emergency department after a fall.  According to EMS patient's from [facility] unit where she had an unwitnessed fall.  Patient has a moderate size hematoma to the back of the head was initially complaining of right forearm pain however denies any pain currently.  Patient does not recall the fall.  States mild pain to the back of her head is her only complaint.   Past Medical History:  Diagnosis Date   Alzheimer disease [facility])    Bladder cancer ([place]) 2009   Cancer of breast ([place]) 2011   R-Breast DCIS, ER/PR negative   Carcinoma in situ of breast 2011   R-breast   Cataract extraction status of left eye    Cataract extraction status of right eye    Cervical cancer ([place]) ?   Colon cancer ([place]) 1992   chemo   Family history of adverse reaction to anesthesia    sister got nauseated after surgery many years ago.   Hypertension 1991   Hypothyroidism    Macular degeneration 2012   Mammographic microcalcification    Restless legs syndrome     Patient Active Problem List   Diagnosis Date Noted   Personal history of breast cancer 12/11/2012    Past Surgical History:  Procedure Laterality Date   ABDOMINAL HYSTERECTOMY  1972   APPENDECTOMY     BLADDER SURGERY  2009   BREAST BIOPSY  1960   BREAST BIOPSY Right 2011   positive- radiation   BREAST CYST EXCISION Right    BREAST SURGERY Right 11/15/09   lumpectomy   COLON SURGERY  1992   resection, chemo   COLONOSCOPY  2007   CYSTOSCOPY  2010   EYE SURGERY  2000, 2010   cataract   mammosite balloon placement  2011   Removed in 2011   POLYPECTOMY  2003   TRANSURETHRAL RESECTION OF BLADDER TUMOR N/A 01/31/2016   Procedure:  TRANSURETHRAL RESECTION OF BLADDER TUMOR (TURBT);  Surgeon: Royston Cowper, MD;  Location: [facility] ORS;  Service: Urology;  Laterality: N/A;    Prior to Admission medications   Medication Sig Start Date End Date Taking? Authorizing Provider  acetaminophen-codeine (TYLENOL #3) 300-30 MG tablet Take 1-2 tablets by mouth every 4 (four) hours as needed for moderate pain. 01/31/16   Royston Cowper, MD  Ascorbic Acid (VITAMIN C WITH ROSE HIPS) 500 MG tablet Take 500 mg by mouth daily.    [provider]  Bevacizumab (AVASTIN) 100 MG/4ML SOLN Last appt 01/10/16, Next appt Oct 2017    [provider]  Calcium Carb-Cholecalciferol (CALCIUM 500+D3) 500-400 MG-UNIT TABS Take 1 tablet by mouth 2 (two) times daily.    [provider]  diphenhydrAMINE (BENADRYL) 25 mg capsule Take 25 mg by mouth every 6 (six) hours as needed for allergies.    [provider]  docusate sodium (COLACE) 100 MG capsule Take 2 capsules (200 mg total) by mouth 2 (two) times daily. 01/31/16   Royston Cowper, MD  folic acid (FOLVITE) Q000111Q MCG tablet Take 800 mcg by mouth daily.     [provider]  galantamine (RAZADYNE) 8 MG tablet Take 8 mg by mouth 2 (two) times daily.  [provider]  Hypromell-Glycerin-Phenyleph (CLEAR EYES PURE RELIEF MS PF OP) Place 1 drop into both eyes daily as needed.    [provider]  levofloxacin (LEVAQUIN) 500 MG tablet Take 1 tablet (500 mg total) by mouth daily. 01/31/16   Royston Cowper, MD  levothyroxine (SYNTHROID, LEVOTHROID) 100 MCG tablet Take 100 mcg by mouth See admin instructions. Pt takes daily in the morning before breakfast Sunday through Friday. Pt does not take med on Saturday    [provider]  lisinopril (PRINIVIL,ZESTRIL) 20 MG tablet Take 2 tablets (40 mg total) by mouth 2 (two) times daily. 02/02/17 03/04/17  Darel Hong, MD  magnesium oxide (MAG-OX) 400 MG tablet Take 400 mg by mouth daily.    [provider]  Methen-Hyosc-Meth Blue-Na Phos (UROGESIC-BLUE) 81.6 MG TABS Take 1 tablet (81.6 mg total) by mouth every 6 (six) hours as needed. 01/31/16   Royston Cowper, MD  Multiple Vitamins-Minerals (OCUTABS) TABS Take 2 tablets by mouth daily.    [provider]  rOPINIRole (REQUIP) 1 MG tablet Take 1 mg by mouth at bedtime. Take 1 hour before bedtime    [provider]  simvastatin (ZOCOR) 20 MG tablet Take 20 mg by mouth every evening.    [provider]  vitamin B-12 (CYANOCOBALAMIN) 500 MCG tablet Take 500 mcg by mouth daily.    [provider]    Allergies  Allergen Reactions   Donepezil Hcl Nausea And Vomiting    No family history on file.  Social History Social History   Tobacco Use   Smoking status: Former    Types: Cigarettes    Quit date: 01/26/2006    Years since quitting: 14.9   Smokeless tobacco: Never  Substance Use Topics   Alcohol use: No   Drug use: No    Review of Systems Unable to obtain adequate/accurate review of systems secondary to baseline dementia.  ____________________________________________   PHYSICAL EXAM:  VITAL SIGNS: ED Triage Vitals  Enc Vitals Group     BP 01/16/21 1255 (!) 215/85     Pulse Rate 01/16/21 1255 (!) 41     Resp 01/16/21 1255 16     Temp 01/16/21 1256 98.4 F (36.9 C)     Temp src --      SpO2 01/16/21 1255 98 %     Weight --      Height --      Head Circumference --      Peak Flow --      Pain Score 01/16/21 1331 5     Pain Loc --      Pain Edu? --      Excl. in [place]? --    Constitutional: Awake alert, no acute distress.  Calm and cooperative. Eyes: Normal exam ENT      Head: Moderate right occipital scalp hematoma.  No abrasion or lacerations.      Mouth/Throat: Mucous membranes are moist. Cardiovascular: Normal rate, regular rhythm.  Respiratory: Normal respiratory effort without tachypnea nor retractions. Breath sounds are clear Gastrointestinal: Soft and nontender.  No distention.   Musculoskeletal: Slight pain with range of motion the right shoulder which daughter states is chronic follows up with orthopedics for this.  No concern for acute fracture or dislocation.  Great range of motion bilateral lower extremities without pain elicited.  No C spine tenderness. Neurologic:  Normal speech and language. No gross focal neurologic deficits  Skin:  Skin is warm, dry and intact.  Psychiatric: Mood and affect are normal  ____________________________________________   RADIOLOGY  Arm x-ray negative. CT scan negative.  ____________________________________________   INITIAL IMPRESSION / ASSESSMENT AND PLAN / ED COURSE  Pertinent labs & imaging results that were available during my care of the patient were reviewed by me and considered in my medical decision making (see chart for details).   Patient presents emergency department from her memory care facility after an unwitnessed fall.  Patient does have a moderate occipital scalp hematoma but no other traumatic findings on examination.  X-rays negative for forearm fracture.  CT scan is negative for acute intracranial abnormality.  Discussed with the patient using ice packs 20 to 30 minutes every 2-3 hours to help reduce swelling and discomfort also using Tylenol 1000 mg every 6 hours as needed for discomfort.  Daughter agreeable to plan of care.  Leslie Osborn was evaluated in Emergency Department on 01/16/2021 for the symptoms described in the history of present illness. She was evaluated in the context of the global COVID-19 pandemic, which necessitated consideration that the patient might be at risk for infection with the SARS-CoV-2 virus that causes COVID-19. Institutional protocols and algorithms that pertain to the evaluation of patients at risk for COVID-19 are in a state of rapid change based on information released by regulatory bodies including the CDC and federal and state organizations. These policies  and algorithms were followed during the patient's care in the ED.  ____________________________________________   FINAL CLINICAL IMPRESSION(S) / ED DIAGNOSES  Fall Head injury   Harvest Dark, MD 01/16/21 1343

## 2021-01-16 NOTE — Discharge Instructions (Addendum)
You have been seen in the emergency department after a fall.  You have a hematoma to the back of your head.  Please use ice for 20 to 30 minutes every 2 hours to help reduce swelling and discomfort over the first 2 to 3 days.  Please use Tylenol 1000 mg every 6 hours as needed for any discomfort.    Return to the emergency department for any significant headache, weakness numbness or any other symptom personally concerning to yourself.

## 2021-08-11 IMAGING — CT CT HEAD W/O CM
4 of 5 series · 15 of 47 positions shown, 17 images · non-contrast
Comparison: None.

CLINICAL DATA: Head trauma

EXAM:
CT HEAD WITHOUT CONTRAST
TECHNIQUE: Contiguous axial images were obtained from the base of the skull
through the vertex without intravenous contrast.

[Series 3: head wo · axial · 0.41mm/px · z∈[-121,-36]mm · 4 of 29 slices shown]
[im 6/29  brain]
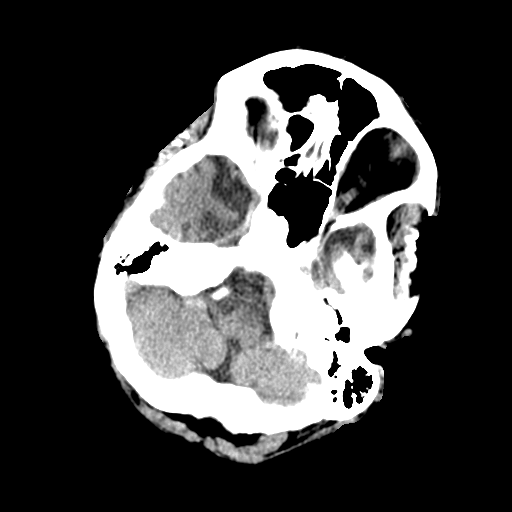
[im 12/29  brain]
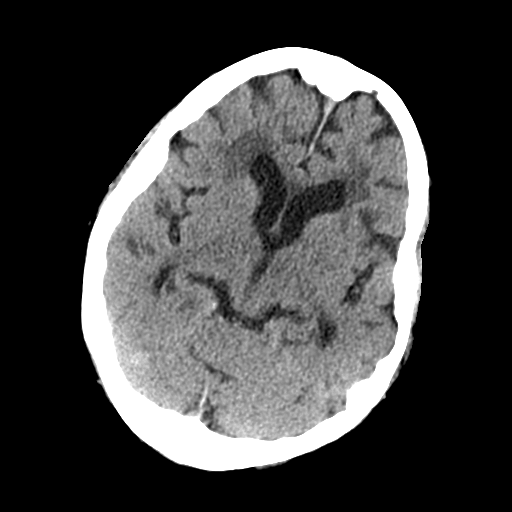
[im 17/29  brain]
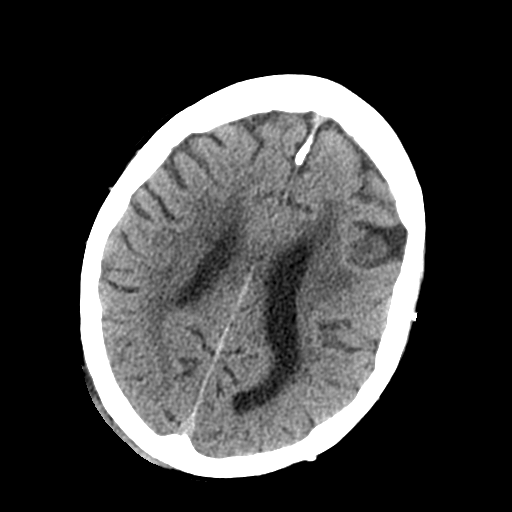
[im 23/29  brain]
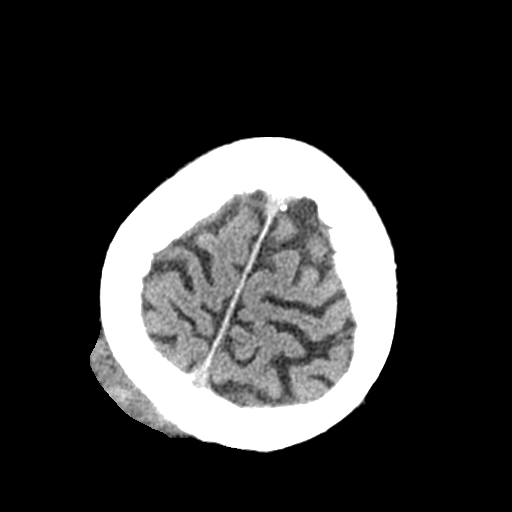

[Series 4: coronal soft tissue · coronal · 0.33mm/px · 3 of 66 slices shown]
[im 22/66  brain]
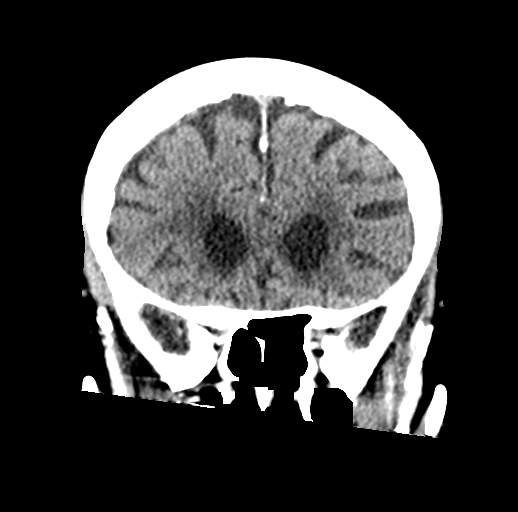
[im 29/66  brain]
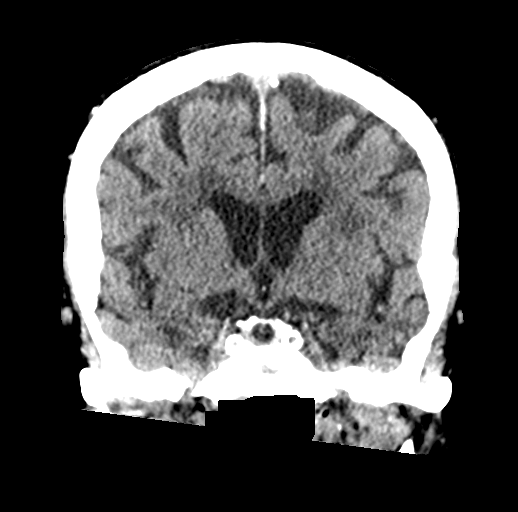
[im 37/66  brain]
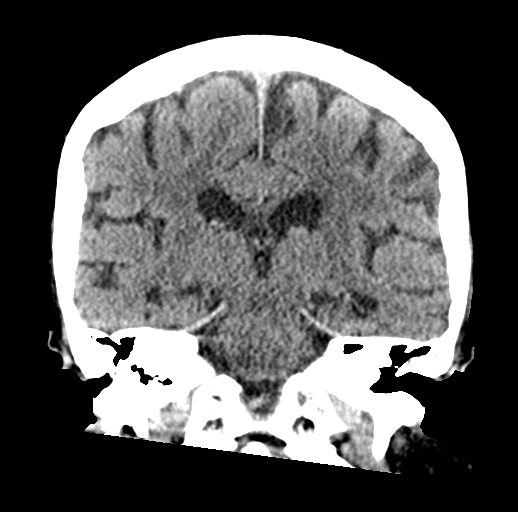

[Series 5: sagittal soft tissue · sagittal · 0.33mm/px · 3 of 53 slices shown]
[im 18/53  brain]
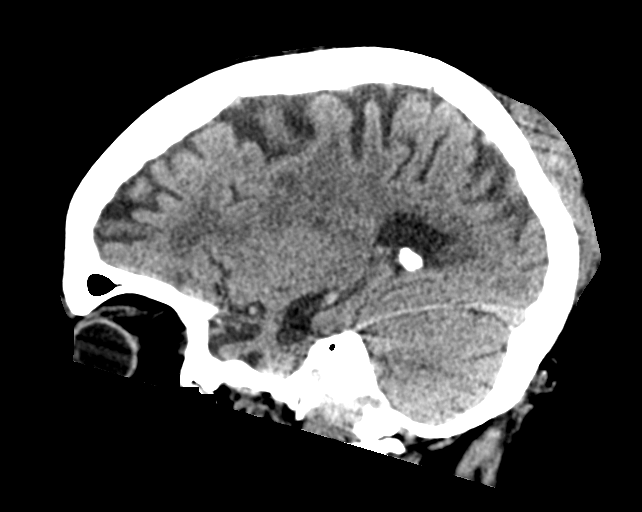
[im 27/53  brain]
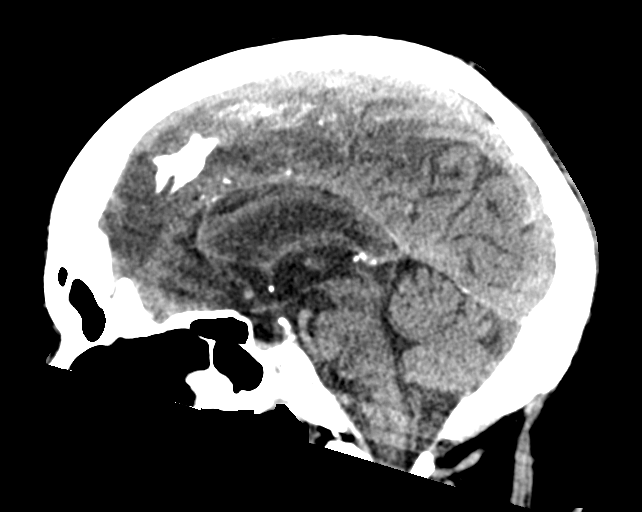
[im 35/53  brain]
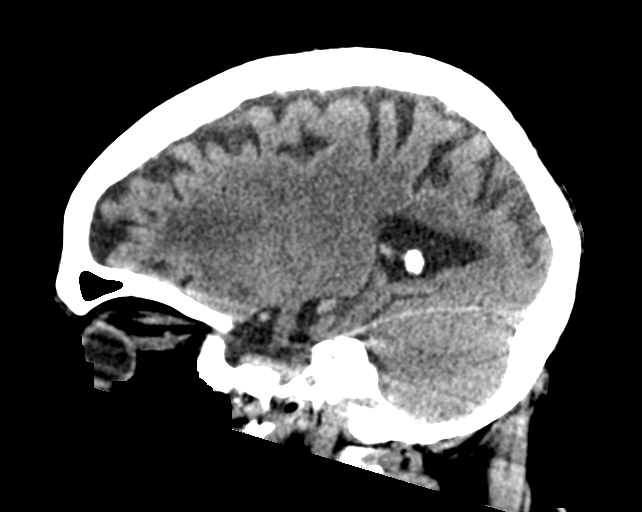

[Series 6: ax head wo recon · axial · 0.35mm/px · z∈[-166,-67]mm · 5 of 32 slices shown, 7 images]
[im 6/32  brain]
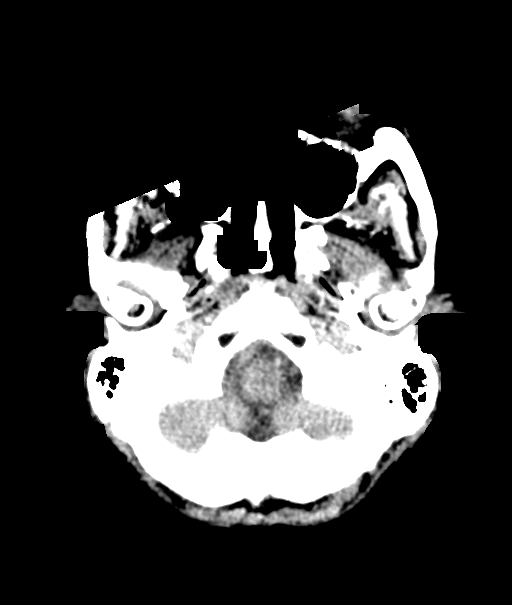
[im 6/32  bone]
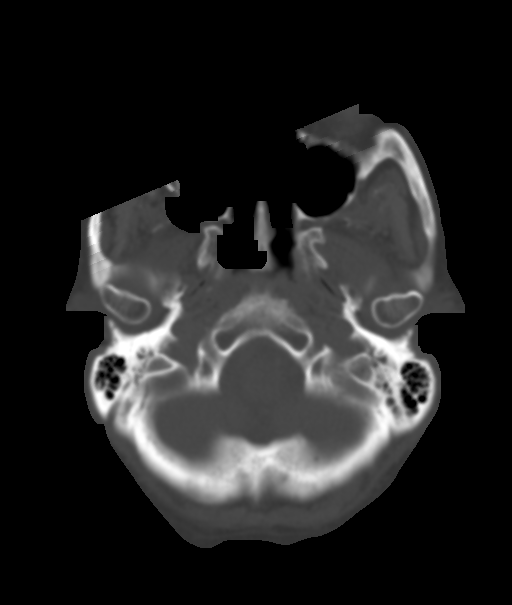
[im 11/32  brain]
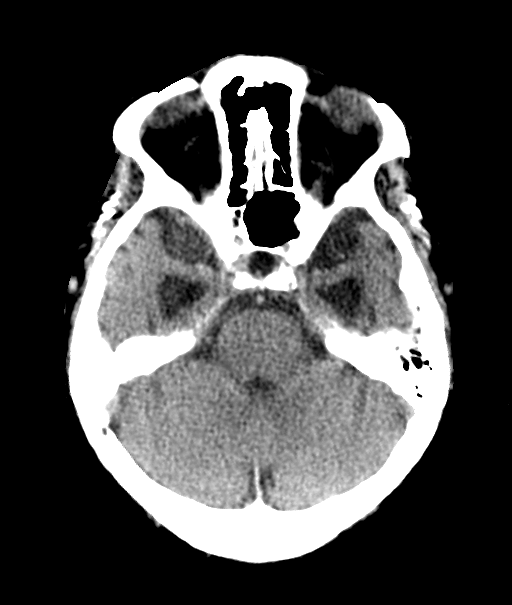
[im 16/32  brain]
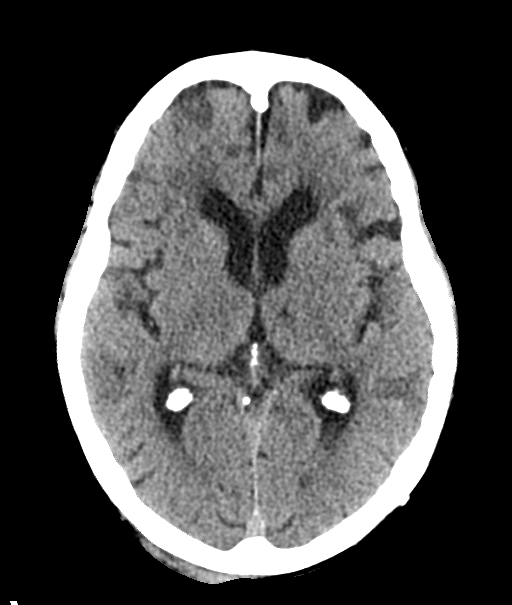
[im 21/32  brain]
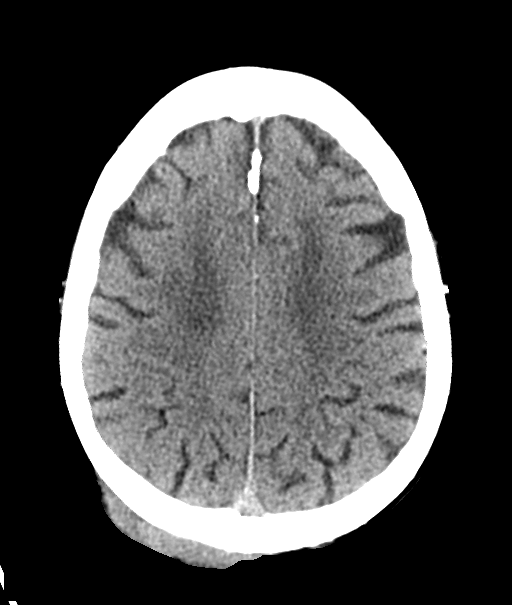
[im 26/32  brain]
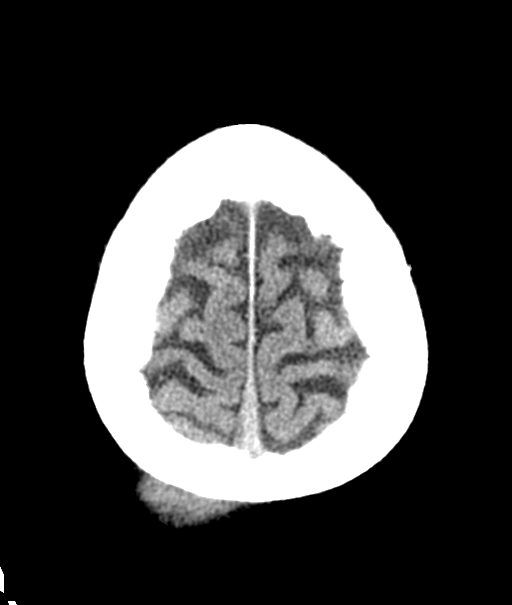
[im 26/32  bone]
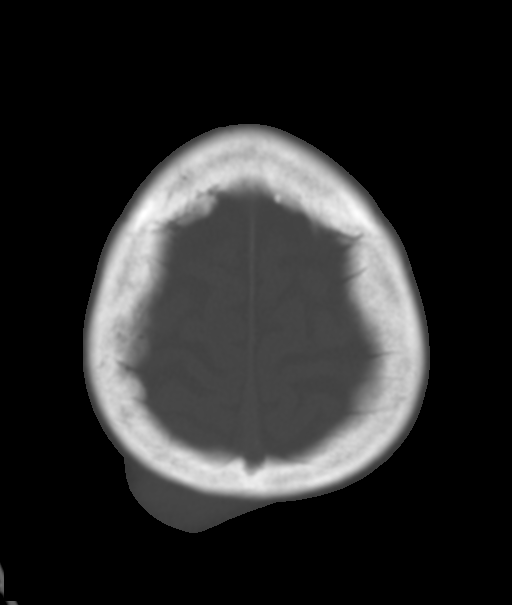

[15 of 47 positions shown; findings below may reference images not displayed]

FINDINGS: Brain: No evidence of acute infarction, hemorrhage, hydrocephalus,
extra-axial collection or mass lesion/mass effect. Bitemporal
atrophy correlating with chart history of Alzheimer's disease.
Chronic small vessel ischemia which is confluent in the deep frontal
white matter.

Vascular: No hyperdense vessel or unexpected calcification.

Skull: Posterior scalp hematoma without calvarial fracture.

Sinuses/Orbits: No acute finding.
IMPRESSION: 1. No acute intracranial injury.
2. Scalp hematoma without calvarial fracture.

## 2021-08-31 ENCOUNTER — Emergency Department: Payer: Medicare Other

## 2021-08-31 ENCOUNTER — Observation Stay
Admission: EM | Admit: 2021-08-31 | Discharge: 2021-09-02 | Disposition: A | Discharge status: Skilled Nursing Facility | Payer: Medicare Other | Attending: Internal Medicine | Admitting: Internal Medicine

## 2021-08-31 ENCOUNTER — Other Ambulatory Visit: Payer: Self-pay

## 2021-08-31 DIAGNOSIS — Z20822 Contact with and (suspected) exposure to covid-19: Secondary | ICD-10-CM | POA: Insufficient documentation

## 2021-08-31 DIAGNOSIS — Y92129 Unspecified place in nursing home as the place of occurrence of the external cause: Secondary | ICD-10-CM | POA: Insufficient documentation

## 2021-08-31 DIAGNOSIS — R2681 Unsteadiness on feet: Secondary | ICD-10-CM | POA: Diagnosis not present

## 2021-08-31 DIAGNOSIS — G9341 Metabolic encephalopathy: Secondary | ICD-10-CM | POA: Diagnosis not present

## 2021-08-31 DIAGNOSIS — I10 Essential (primary) hypertension: Secondary | ICD-10-CM | POA: Diagnosis present

## 2021-08-31 DIAGNOSIS — Z853 Personal history of malignant neoplasm of breast: Secondary | ICD-10-CM | POA: Insufficient documentation

## 2021-08-31 DIAGNOSIS — I482 Chronic atrial fibrillation, unspecified: Secondary | ICD-10-CM | POA: Diagnosis not present

## 2021-08-31 DIAGNOSIS — Z87891 Personal history of nicotine dependence: Secondary | ICD-10-CM | POA: Insufficient documentation

## 2021-08-31 DIAGNOSIS — I4891 Unspecified atrial fibrillation: Secondary | ICD-10-CM

## 2021-08-31 DIAGNOSIS — G309 Alzheimer's disease, unspecified: Secondary | ICD-10-CM | POA: Insufficient documentation

## 2021-08-31 DIAGNOSIS — W19XXXA Unspecified fall, initial encounter: Secondary | ICD-10-CM | POA: Diagnosis present

## 2021-08-31 DIAGNOSIS — E039 Hypothyroidism, unspecified: Secondary | ICD-10-CM | POA: Diagnosis present

## 2021-08-31 DIAGNOSIS — R4182 Altered mental status, unspecified: Secondary | ICD-10-CM

## 2021-08-31 DIAGNOSIS — Z85038 Personal history of other malignant neoplasm of large intestine: Secondary | ICD-10-CM | POA: Diagnosis not present

## 2021-08-31 DIAGNOSIS — F0280 Dementia in other diseases classified elsewhere without behavioral disturbance: Secondary | ICD-10-CM | POA: Diagnosis present

## 2021-08-31 DIAGNOSIS — Z8541 Personal history of malignant neoplasm of cervix uteri: Secondary | ICD-10-CM | POA: Diagnosis not present

## 2021-08-31 DIAGNOSIS — Z8551 Personal history of malignant neoplasm of bladder: Secondary | ICD-10-CM | POA: Insufficient documentation

## 2021-08-31 DIAGNOSIS — I480 Paroxysmal atrial fibrillation: Secondary | ICD-10-CM | POA: Diagnosis not present

## 2021-08-31 DIAGNOSIS — Z79899 Other long term (current) drug therapy: Secondary | ICD-10-CM | POA: Diagnosis not present

## 2021-08-31 DIAGNOSIS — F32A Depression, unspecified: Secondary | ICD-10-CM | POA: Diagnosis present

## 2021-08-31 DIAGNOSIS — E785 Hyperlipidemia, unspecified: Secondary | ICD-10-CM | POA: Diagnosis present

## 2021-08-31 LAB — CBC WITH DIFFERENTIAL/PLATELET
Abs Immature Granulocytes: 0.03 10*3/uL (ref 0.00–0.07)
Basophils Absolute: 0 10*3/uL (ref 0.0–0.1)
Basophils Relative: 1 %
Eosinophils Absolute: 0 10*3/uL (ref 0.0–0.5)
Eosinophils Relative: 1 %
HCT: 40.3 % (ref 36.0–46.0)
Hemoglobin: 13.1 g/dL (ref 12.0–15.0)
Immature Granulocytes: 0 %
Lymphocytes Relative: 14 %
Lymphs Abs: 1 10*3/uL (ref 0.7–4.0)
MCH: 30.8 pg (ref 26.0–34.0)
MCHC: 32.5 g/dL (ref 30.0–36.0)
MCV: 94.6 fL (ref 80.0–100.0)
Monocytes Absolute: 0.9 10*3/uL (ref 0.1–1.0)
Monocytes Relative: 12 %
Neutro Abs: 5.1 10*3/uL (ref 1.7–7.7)
Neutrophils Relative %: 72 %
Platelets: 162 10*3/uL (ref 150–400)
RBC: 4.26 MIL/uL (ref 3.87–5.11)
RDW: 14.9 % (ref 11.5–15.5)
WBC: 7.1 10*3/uL (ref 4.0–10.5)
nRBC: 0 % (ref 0.0–0.2)

## 2021-08-31 LAB — URINALYSIS, COMPLETE (UACMP) WITH MICROSCOPIC
Bilirubin Urine: NEGATIVE
Glucose, UA: NEGATIVE mg/dL
Hgb urine dipstick: NEGATIVE
Ketones, ur: NEGATIVE mg/dL
Leukocytes,Ua: NEGATIVE
Nitrite: NEGATIVE
Protein, ur: NEGATIVE mg/dL
Specific Gravity, Urine: 1.005 (ref 1.005–1.030)
pH: 8 (ref 5.0–8.0)

## 2021-08-31 LAB — BASIC METABOLIC PANEL
Anion gap: 7 (ref 5–15)
BUN: 18 mg/dL (ref 8–23)
CO2: 30 mmol/L (ref 22–32)
Calcium: 9.3 mg/dL (ref 8.9–10.3)
Chloride: 97 mmol/L — ABNORMAL LOW (ref 98–111)
Creatinine, Ser: 0.87 mg/dL (ref 0.44–1.00)
GFR, Estimated: >60 mL/min (ref >60)
Glucose, Bld: 113 mg/dL — ABNORMAL HIGH (ref 70–99)
Potassium: 4.5 mmol/L (ref 3.5–5.1)
Sodium: 134 mmol/L — ABNORMAL LOW (ref 135–145)

## 2021-08-31 LAB — RESP PANEL BY RT-PCR (FLU A&B, COVID) ARPGX2
Influenza A by PCR: NEGATIVE
Influenza B by PCR: NEGATIVE
SARS Coronavirus 2 by RT PCR: NEGATIVE

## 2021-08-31 LAB — MAGNESIUM: Magnesium: 1.9 mg/dL (ref 1.7–2.4)

## 2021-08-31 LAB — TROPONIN I (HIGH SENSITIVITY): Troponin I (High Sensitivity): 13 ng/L (ref <18)

## 2021-08-31 LAB — TSH: TSH: 7.148 u[IU]/mL — ABNORMAL HIGH (ref 0.350–4.500)

## 2021-08-31 LAB — T4, FREE: Free T4: 1.1 ng/dL (ref 0.61–1.12)

## 2021-08-31 MED ORDER — ENOXAPARIN SODIUM 40 MG/0.4ML IJ SOSY
40.0000 mg | PREFILLED_SYRINGE | INTRAMUSCULAR | Status: DC
  Administered 2021-08-31 – 2021-09-01 (×2): 40 mg via SUBCUTANEOUS
  Filled 2021-08-31 (×2): qty 0.4

## 2021-08-31 MED ORDER — STROKE: EARLY STAGES OF RECOVERY BOOK: Freq: Once | Status: DC

## 2021-08-31 MED ORDER — HALOPERIDOL LACTATE 5 MG/ML IJ SOLN
4.0000 mg | Freq: Once | INTRAMUSCULAR | Status: AC
  Administered 2021-08-31: 19:00:00 4 mg via INTRAVENOUS

## 2021-08-31 MED ORDER — OCUVITE-LUTEIN PO CAPS
2.0000 | ORAL_CAPSULE | Freq: Every day | ORAL | Status: DC
  Administered 2021-09-01 – 2021-09-02 (×2): 2 via ORAL
  Filled 2021-08-31 (×2): qty 2

## 2021-08-31 MED ORDER — LISINOPRIL 20 MG PO TABS
40.0000 mg | ORAL_TABLET | Freq: Every day | ORAL | Status: DC
  Administered 2021-09-01 – 2021-09-02 (×2): 40 mg via ORAL
  Filled 2021-08-31 (×2): qty 2

## 2021-08-31 MED ORDER — HYDRALAZINE HCL 20 MG/ML IJ SOLN: 5.0000 mg | INTRAMUSCULAR | Status: DC | PRN

## 2021-08-31 MED ORDER — SIMVASTATIN 20 MG PO TABS
20.0000 mg | ORAL_TABLET | Freq: Every evening | ORAL | Status: DC
  Administered 2021-09-01: 20 mg via ORAL
  Filled 2021-08-31: qty 1

## 2021-08-31 MED ORDER — MORPHINE SULFATE (PF) 2 MG/ML IV SOLN
1.0000 mg | Freq: Once | INTRAVENOUS | Status: AC
  Administered 2021-08-31: 18:00:00 1 mg via INTRAVENOUS
  Filled 2021-08-31: qty 1

## 2021-08-31 MED ORDER — LEVOTHYROXINE SODIUM 50 MCG PO TABS: 50.0000 ug | ORAL_TABLET | Freq: Every day | ORAL | Status: DC

## 2021-08-31 MED ORDER — HALOPERIDOL LACTATE 5 MG/ML IJ SOLN
2.0000 mg | Freq: Once | INTRAMUSCULAR | Status: AC
  Administered 2021-08-31: 15:00:00 2 mg via INTRAMUSCULAR
  Filled 2021-08-31: qty 1

## 2021-08-31 MED ORDER — LORAZEPAM 2 MG/ML IJ SOLN
1.0000 mg | Freq: Once | INTRAMUSCULAR | Status: AC
  Administered 2021-08-31: 16:00:00 1 mg via INTRAVENOUS
  Filled 2021-08-31: qty 1

## 2021-08-31 MED ORDER — HALOPERIDOL LACTATE 5 MG/ML IJ SOLN
1.0000 mg | Freq: Three times a day (TID) | INTRAMUSCULAR | Status: DC | PRN
  Filled 2021-08-31: qty 1

## 2021-08-31 MED ORDER — TROLAMINE SALICYLATE 10 % EX CREA
1.0000 | TOPICAL_CREAM | Freq: Two times a day (BID) | CUTANEOUS | Status: DC | PRN
  Filled 2021-08-31: qty 85

## 2021-08-31 MED ORDER — ACETAMINOPHEN 325 MG PO TABS: 650.0000 mg | ORAL_TABLET | Freq: Four times a day (QID) | ORAL | Status: DC | PRN

## 2021-08-31 MED ORDER — LEVOTHYROXINE SODIUM 50 MCG PO TABS
75.0000 ug | ORAL_TABLET | Freq: Every day | ORAL | Status: DC
  Administered 2021-09-02: 75 ug via ORAL
  Filled 2021-08-31: qty 1

## 2021-08-31 MED ORDER — ONDANSETRON HCL 4 MG/2ML IJ SOLN: 4.0000 mg | Freq: Three times a day (TID) | INTRAMUSCULAR | Status: DC | PRN

## 2021-08-31 MED ORDER — ACETAMINOPHEN 650 MG RE SUPP: 650.0000 mg | Freq: Four times a day (QID) | RECTAL | Status: DC | PRN

## 2021-08-31 MED ORDER — ESCITALOPRAM OXALATE 10 MG PO TABS
5.0000 mg | ORAL_TABLET | Freq: Every day | ORAL | Status: DC
  Administered 2021-09-01 – 2021-09-02 (×2): 5 mg via ORAL
  Filled 2021-08-31 (×2): qty 1

## 2021-08-31 MED ORDER — MELATONIN 5 MG PO TABS
2.5000 mg | ORAL_TABLET | Freq: Every day | ORAL | Status: DC
  Administered 2021-09-01: 2.5 mg via ORAL
  Filled 2021-08-31: qty 1

## 2021-08-31 MED ORDER — GALANTAMINE HYDROBROMIDE 4 MG PO TABS
12.0000 mg | ORAL_TABLET | Freq: Two times a day (BID) | ORAL | Status: DC
  Administered 2021-09-01 – 2021-09-02 (×3): 12 mg via ORAL
  Filled 2021-08-31 (×5): qty 3

## 2021-08-31 MED ORDER — ASPIRIN EC 81 MG PO TBEC
81.0000 mg | DELAYED_RELEASE_TABLET | Freq: Every day | ORAL | Status: DC
  Administered 2021-09-01 – 2021-09-02 (×2): 81 mg via ORAL
  Filled 2021-08-31 (×2): qty 1

## 2021-08-31 MED ORDER — AMLODIPINE BESYLATE 5 MG PO TABS
2.5000 mg | ORAL_TABLET | Freq: Every day | ORAL | Status: DC
  Administered 2021-09-01 – 2021-09-02 (×2): 2.5 mg via ORAL
  Filled 2021-08-31 (×2): qty 1

## 2021-08-31 NOTE — ED Notes (Signed)
Light green, lavender, blue, red top tubes sent to lab. ? ?Pt transported to CT via stretcher with CT tech. ?

## 2021-08-31 NOTE — ED Triage Notes (Signed)
Pt BIB EMS from [facility] for fall & confusion. Pt fell at 0740 today; no LOC, did not hit her head. Pts daughter noticed pt was not able to recognize her & was unable to state her own name, which is not her baseline ?

## 2021-08-31 NOTE — ED Notes (Signed)
Safety sitter at bedside. ? ?This RN & CNA/safety sitter provided peri care, changed sheets, changed pts clothes & placed pt in gown; brief placed on pt. Warm blankets given to pt, but pt keeps removing blankets. Pt continues to try to get OOB. Lights dimmed to decrease stimulation. ? ?Bed low & locked; safety sitter remains at bedside. ?

## 2021-08-31 NOTE — ED Notes (Signed)
Pt resting comfortably in bed, NAD. No needs verbalized/identified at this time. Bed low & locked; call light within reach; daughter at bedside. ?

## 2021-08-31 NOTE — ED Notes (Signed)
Urine sent to lab 

## 2021-08-31 NOTE — ED Notes (Signed)
MRI called for screening questions; speaking to sister on phone. ?

## 2021-08-31 NOTE — ED Notes (Signed)
Pt transported to MRI via stretcher with MRI tech. ?

## 2021-08-31 NOTE — H&P (Signed)
History and Physical    Leslie Osborn:[id number] DOB: [birth year] DOA: 08/31/2021  Referring MD/NP/PA:   PCP: Albina Billet, MD   Patient coming from:  The patient is coming from assisted living facility   Chief Complaint: AMS and fall  HPI: Leslie Osborn is a 88 y.o. female with medical history significant of hypertension, hyperlipidemia, hypothyroidism, depression, RLS, colon cancer (s/p of chemotherapy), atrial fibrillation not on anticoagulants, Alzheimer's disease, breast cancer (carcinoma in situ), who presents with altered mental status and fall.  Per her sister at bedside, at her normal baseline, patient can recognize her sister, but most of the time pt is not orientated to the place and time.  Today patient is more confused.  He is able to recognize her sister at the bedside.  Patient has agitation.  She had unwitnessed fall in the morning. She has bruise in her left forearm. She seems to have difficult speaking per her sister.  She moves all extremities.  No facial droop.  Patient has a dry cough, does not seem to have chest pain or shortness of breath.  She had 1 loose stool bowel movement this morning, currently no active nausea vomiting, diarrhea or abdominal pain.  Not sure if patient has symptoms of UTI.  Data Reviewed and ED Course: pt was found to have WBC 7.1, negative urinalysis, TSH 7.148, troponin level 13, pending COVID PCR, electrolytes renal function okay, temperature 97.4, blood pressure 112/53, 164/87, 147/96, heart rate 38, 104, RR 23, 13, oxygen saturation 95% on room air.  CT of head is negative for acute intracranial abnormalities.  CT of C-spine is negative for acute injury, but showed degenerative disc disease.  X-ray of her left forearm and left wrist negative for bony fracture.  Patient is placed on telemetry bed for patient.    MRI-brain: 1. Motion degraded examination. 2. Questionable small acute to subacute left corona radiata infarct versus artifact. 3.  Moderate chronic small vessel ischemic disease. 4. Advanced bilateral temporal lobe atrophy.     EKG: I have personally reviewed.  Atrial fibrillation, heart rate 44, right axis deviation, incomplete right bundle blockage  Review of Systems: Could not be accurately reviewed due to history of dementia and altered mental status.   Allergy:  Allergies  Allergen Reactions   Donepezil Hcl Nausea And Vomiting and Diarrhea    Past Medical History:  Diagnosis Date   Alzheimer disease ([place])    Bladder cancer ([place]) 2009   Cancer of breast ([place]) 2011   R-Breast DCIS, ER/PR negative   Carcinoma in situ of breast 2011   R-breast   Cataract extraction status of left eye    Cataract extraction status of right eye    Cervical cancer ([place]) ?   Colon cancer ([place]) 1992   chemo   Family history of adverse reaction to anesthesia    sister got nauseated after surgery many years ago.   Hypertension 1991   Hypothyroidism    Macular degeneration 2012   Mammographic microcalcification    Restless legs syndrome     Past Surgical History:  Procedure Laterality Date   ABDOMINAL HYSTERECTOMY  1972   APPENDECTOMY     BLADDER SURGERY  2009   BREAST BIOPSY  1960   BREAST BIOPSY Right 2011   positive- radiation   BREAST CYST EXCISION Right    BREAST SURGERY Right 11/15/09   lumpectomy   COLON SURGERY  1992   resection, chemo   COLONOSCOPY  2007  CYSTOSCOPY  2010   EYE SURGERY  2000, 2010   cataract   mammosite balloon placement  2011   Removed in 2011   POLYPECTOMY  2003   TRANSURETHRAL RESECTION OF BLADDER TUMOR N/A 01/31/2016   Procedure: TRANSURETHRAL RESECTION OF BLADDER TUMOR (TURBT);  Surgeon: Royston Cowper, MD;  Location: [facility] ORS;  Service: Urology;  Laterality: N/A;    Social History:  reports that she quit smoking about 15 years ago. She has never used smokeless tobacco. She reports that she does not drink alcohol and does not use drugs.  Family History: History reviewed. No  pertinent family history.  Could not be reviewed accurately due to altered mental status and history of dementia.  Prior to Admission medications   Medication Sig Start Date End Date Taking? Authorizing Provider  amLODipine (NORVASC) 2.5 MG tablet Take 2.5 mg by mouth daily.   Yes [provider]  atenolol (TENORMIN) 50 MG tablet Take 50 mg by mouth daily.   Yes [provider]  escitalopram (LEXAPRO) 5 MG tablet Take 5 mg by mouth daily.   Yes [provider]  galantamine (RAZADYNE) 12 MG tablet Take 12 mg by mouth 2 (two) times daily.   Yes [provider]  hydrochlorothiazide (HYDRODIURIL) 12.5 MG tablet Take 12.5 mg by mouth daily. 07/07/21  Yes [provider]  levothyroxine (SYNTHROID) 50 MCG tablet Take 50 mcg by mouth daily before breakfast.   Yes [provider]  lisinopril (ZESTRIL) 40 MG tablet Take 40 mg by mouth in the morning and at bedtime.   Yes [provider]  melatonin 3 MG TABS tablet Take 3 mg by mouth at bedtime.   Yes [provider]  Multiple Vitamins-Minerals (OCUTABS) TABS Take 2 tablets by mouth daily.   Yes [provider]  simvastatin (ZOCOR) 20 MG tablet Take 20 mg by mouth every evening.   Yes [provider]  trolamine salicylate (ASPERCREME) 10 % cream Apply 1 application. topically 2 (two) times daily as needed for muscle pain.   Yes [provider]  Calcium Carb-Cholecalciferol 500-400 MG-UNIT TABS Take 1 tablet by mouth 2 (two) times daily. Patient not taking: Reported on 08/31/2021    [provider]    Physical Exam: Vitals:   08/31/21 1155 08/31/21 1208 08/31/21 1230 08/31/21 1300  BP:  (!) 164/87 (!) 112/53 (!) 147/96  Pulse: (!) 38 (!) 46 (!) 47 (!) 104  Resp: 18  (!) 23 13  Temp:      TempSrc:      SpO2: 98% 95% 95% 98%   General: Not in acute distress HEENT:       Eyes: PERRL, EOMI, no scleral icterus.       ENT: No discharge from the ears  and nose.       Neck: No JVD, no bruit, no mass felt. Heme: No neck lymph node enlargement. Cardiac: S1/S2, RRR, No murmurs, No gallops or rubs. Respiratory: No rales, wheezing, rhonchi or rubs. GI: Soft, nondistended, nontender, no organomegaly, BS present. GU: No hematuria Ext: No pitting leg edema bilaterally. 1+DP/PT pulse bilaterally. Musculoskeletal: No joint deformities, No joint redness or warmth, no limitation of ROM in spin. Skin: No rashes.  Neuro: Confused, has agitation, not oriented x3, cranial nerves II-XII grossly intact, moves all extremities normally.  Psych: Patient is not psychotic, no suicidal or hemocidal ideation.  Labs on Admission: I have personally reviewed following labs and imaging studies  CBC: Recent Labs  Lab 08/31/21  1132  WBC 7.1  NEUTROABS 5.1  HGB 13.1  HCT 40.3  MCV 94.6  PLT 409   Basic Metabolic Panel: Recent Labs  Lab 08/31/21 1132  NA 134*  K 4.5  CL 97*  CO2 30  GLUCOSE 113*  BUN 18  CREATININE 0.87  CALCIUM 9.3  MG 1.9   GFR: CrCl cannot be calculated (Unknown ideal weight.). Liver Function Tests: No results for input(s): AST, ALT, ALKPHOS, BILITOT, PROT, ALBUMIN in the last 168 hours. No results for input(s): LIPASE, AMYLASE in the last 168 hours. No results for input(s): AMMONIA in the last 168 hours. Coagulation Profile: No results for input(s): INR, PROTIME in the last 168 hours. Cardiac Enzymes: No results for input(s): CKTOTAL, CKMB, CKMBINDEX, TROPONINI in the last 168 hours. BNP (last 3 results) No results for input(s): PROBNP in the last 8760 hours. HbA1C: No results for input(s): HGBA1C in the last 72 hours. CBG: No results for input(s): GLUCAP in the last 168 hours. Lipid Profile: No results for input(s): CHOL, HDL, LDLCALC, TRIG, CHOLHDL, LDLDIRECT in the last 72 hours. Thyroid Function Tests: Recent Labs    08/31/21 1132  TSH 7.148*   Anemia Panel: No results for input(s): VITAMINB12, FOLATE,  FERRITIN, TIBC, IRON, RETICCTPCT in the last 72 hours. Urine analysis:    Component Value Date/Time   COLORURINE YELLOW (A) 08/31/2021 1350   APPEARANCEUR CLEAR (A) 08/31/2021 1350   LABSPEC 1.005 08/31/2021 1350   PHURINE 8.0 08/31/2021 1350   GLUCOSEU NEGATIVE 08/31/2021 1350   HGBUR NEGATIVE 08/31/2021 1350   BILIRUBINUR NEGATIVE 08/31/2021 1350   KETONESUR NEGATIVE 08/31/2021 1350   PROTEINUR NEGATIVE 08/31/2021 1350   NITRITE NEGATIVE 08/31/2021 1350   LEUKOCYTESUR NEGATIVE 08/31/2021 1350   Sepsis Labs: '@LABRCNTIP'$ (procalcitonin:4,lacticidven:4) ) Recent Results (from the past 240 hour(s))  Resp Panel by RT-PCR (Flu A&B, Covid) Nasopharyngeal Swab     Status: None   Collection Time: 08/31/21  2:43 PM   Specimen: Nasopharyngeal Swab; Nasopharyngeal(NP) swabs in vial transport medium  Result Value Ref Range Status   SARS Coronavirus 2 by RT PCR NEGATIVE NEGATIVE Final    Comment: (NOTE) SARS-CoV-2 target nucleic acids are NOT DETECTED.  The SARS-CoV-2 RNA is generally detectable in upper respiratory specimens during the acute phase of infection. The lowest concentration of SARS-CoV-2 viral copies this assay can detect is 138 copies/mL. A negative result does not preclude SARS-Cov-2 infection and should not be used as the sole basis for treatment or other patient management decisions. A negative result may occur with  improper specimen collection/handling, submission of specimen other than nasopharyngeal swab, presence of viral mutation(s) within the areas targeted by this assay, and inadequate number of viral copies(<138 copies/mL). A negative result must be combined with clinical observations, patient history, and epidemiological information. The expected result is Negative.  Fact Sheet for Patients:  EntrepreneurPulse.com.au  Fact Sheet for Healthcare Providers:  IncredibleEmployment.be  This test is no t yet approved or cleared  by the [place] FDA and  has been authorized for detection and/or diagnosis of SARS-CoV-2 by FDA under an Emergency Use Authorization (EUA). This EUA will remain  in effect (meaning this test can be used) for the duration of the COVID-19 declaration under Section 564(b)(1) of the Act, 21 U.S.C.section 360bbb-3(b)(1), unless the authorization is terminated  or revoked sooner.       Influenza A by PCR NEGATIVE NEGATIVE Final   Influenza B by PCR NEGATIVE NEGATIVE Final    Comment: (NOTE) The Xpert Xpress  SARS-CoV-2/FLU/RSV plus assay is intended as an aid in the diagnosis of influenza from Nasopharyngeal swab specimens and should not be used as a sole basis for treatment. Nasal washings and aspirates are unacceptable for Xpert Xpress SARS-CoV-2/FLU/RSV testing.  Fact Sheet for Patients: EntrepreneurPulse.com.au  Fact Sheet for Healthcare Providers: IncredibleEmployment.be  This test is not yet approved or cleared by the [place] FDA and has been authorized for detection and/or diagnosis of SARS-CoV-2 by FDA under an Emergency Use Authorization (EUA). This EUA will remain in effect (meaning this test can be used) for the duration of the COVID-19 declaration under Section 564(b)(1) of the Act, 21 U.S.C. section 360bbb-3(b)(1), unless the authorization is terminated or revoked.  Performed at [facility], [address] [place], [place] [address]      Radiological Exams on Admission: DG Forearm Left  Result Date: 08/31/2021 CLINICAL DATA:  Trauma, fall EXAM: LEFT FOREARM - 2 VIEW COMPARISON:  None. FINDINGS: No fracture or dislocation is seen. There is 2 mm metallic density in the soft tissues medial to the distal ulna, possibly residual from previous injury. There are scattered arterial calcifications in the soft tissues. IMPRESSION: No recent fracture or dislocation is seen in the left forearm. Electronically Signed   By:  Elmer Picker M.D.   On: 08/31/2021 13:54   DG Wrist Complete Left  Result Date: 08/31/2021 CLINICAL DATA:  Trauma, fall EXAM: LEFT WRIST - COMPLETE 3+ VIEW COMPARISON:  None. FINDINGS: No recent fracture or dislocation is seen. Severe degenerative changes are noted in first carpometacarpal joint with bony spurs and adjacent loose bodies. Degenerative changes are noted in first metacarpophalangeal joint. There is 2 mm metallic density medial to the distal ulna possibly residual from previous injury. Arterial calcifications are seen in the soft tissues. IMPRESSION: No recent fracture or dislocation is seen in the left wrist. Severe degenerative changes are noted in first carpometacarpal joint. Electronically Signed   By: Elmer Picker M.D.   On: 08/31/2021 13:53   CT HEAD WO CONTRAST (5MM)  Result Date: 08/31/2021 CLINICAL DATA:  Head trauma, minor (Age >= 65y); Neck trauma (Age >= 65y) EXAM: CT HEAD WITHOUT CONTRAST CT CERVICAL SPINE WITHOUT CONTRAST TECHNIQUE: Multidetector CT imaging of the head and cervical spine was performed following the standard protocol without intravenous contrast. Multiplanar CT image reconstructions of the cervical spine were also generated. RADIATION DOSE REDUCTION: This exam was performed according to the departmental dose-optimization program which includes automated exposure control, adjustment of the mA and/or kV according to patient size and/or use of iterative reconstruction technique. COMPARISON:  CT head 01/16/2021. FINDINGS: CT HEAD FINDINGS Brain: No evidence of acute infarction, hemorrhage, hydrocephalus, extra-axial collection or mass lesion/mass effect. Chronic mesial temporal atrophy with associated ex vacuo dilation of the temporal horns. Patchy white matter hypoattenuation, nonspecific but compatible with chronic microvascular ischemic disease. Vascular: Calcific intracranial atherosclerosis. Skull: No evidence of acute fracture. Sinuses/Orbits:  Visualized sinuses are clear.  Unremarkable orbits. Other: No mastoid effusions. CT CERVICAL SPINE FINDINGS Mildly motion limited study. Alignment: Straightening of the normal cervical lordosis. Slight retrolisthesis of C5 on C6, favor degenerative given degenerative changes at this level. Otherwise, no substantial sagittal subluxation. Skull base and vertebrae: Vertebral body heights are maintained. No evidence of acute fracture. Soft tissues and spinal canal: No prevertebral fluid or swelling. No visible canal hematoma. Disc levels: Degenerative changes greatest at C5-C6 where there is left eccentric disc height loss, endplate spurring and facet/uncovertebral hypertrophy. Resulting potentially severe left foraminal stenosis.  Upper chest: Visualized lung apices are clear. Centrilobular emphysema. Aortic atherosclerosis. IMPRESSION: CT head: 1. No evidence of acute intracranial abnormality. 2. Chronic mesial temporal atrophy, which can be seen with Alzheimer's disease. CT cervical spine: 1. No evidence of acute fracture or traumatic malalignment on this mildly motion limited study. 2. Left eccentric C5-C6 degenerative change with potentially severe left foraminal stenosis. An MRI could better evaluate if clinically indicated. 3. Aortic Atherosclerosis (ICD10-I70.0) and Emphysema (ICD10-J43.9). Electronically Signed   By: Margaretha Sheffield M.D.   On: 08/31/2021 11:57   CT Cervical Spine Wo Contrast  Result Date: 08/31/2021 CLINICAL DATA:  Head trauma, minor (Age >= 65y); Neck trauma (Age >= 65y) EXAM: CT HEAD WITHOUT CONTRAST CT CERVICAL SPINE WITHOUT CONTRAST TECHNIQUE: Multidetector CT imaging of the head and cervical spine was performed following the standard protocol without intravenous contrast. Multiplanar CT image reconstructions of the cervical spine were also generated. RADIATION DOSE REDUCTION: This exam was performed according to the departmental dose-optimization program which includes automated  exposure control, adjustment of the mA and/or kV according to patient size and/or use of iterative reconstruction technique. COMPARISON:  CT head 01/16/2021. FINDINGS: CT HEAD FINDINGS Brain: No evidence of acute infarction, hemorrhage, hydrocephalus, extra-axial collection or mass lesion/mass effect. Chronic mesial temporal atrophy with associated ex vacuo dilation of the temporal horns. Patchy white matter hypoattenuation, nonspecific but compatible with chronic microvascular ischemic disease. Vascular: Calcific intracranial atherosclerosis. Skull: No evidence of acute fracture. Sinuses/Orbits: Visualized sinuses are clear.  Unremarkable orbits. Other: No mastoid effusions. CT CERVICAL SPINE FINDINGS Mildly motion limited study. Alignment: Straightening of the normal cervical lordosis. Slight retrolisthesis of C5 on C6, favor degenerative given degenerative changes at this level. Otherwise, no substantial sagittal subluxation. Skull base and vertebrae: Vertebral body heights are maintained. No evidence of acute fracture. Soft tissues and spinal canal: No prevertebral fluid or swelling. No visible canal hematoma. Disc levels: Degenerative changes greatest at C5-C6 where there is left eccentric disc height loss, endplate spurring and facet/uncovertebral hypertrophy. Resulting potentially severe left foraminal stenosis. Upper chest: Visualized lung apices are clear. Centrilobular emphysema. Aortic atherosclerosis. IMPRESSION: CT head: 1. No evidence of acute intracranial abnormality. 2. Chronic mesial temporal atrophy, which can be seen with Alzheimer's disease. CT cervical spine: 1. No evidence of acute fracture or traumatic malalignment on this mildly motion limited study. 2. Left eccentric C5-C6 degenerative change with potentially severe left foraminal stenosis. An MRI could better evaluate if clinically indicated. 3. Aortic Atherosclerosis (ICD10-I70.0) and Emphysema (ICD10-J43.9). Electronically Signed   By:  Margaretha Sheffield M.D.   On: 08/31/2021 11:57      Assessment/Plan Principal Problem:   Acute metabolic encephalopathy Active Problems:   Hypothyroidism   Hypertension   Alzheimer disease ([place])   Fall   Atrial fibrillation, chronic (H[facility])   HLD (hyperlipidemia)   HTN (hypertension)   Depression   Acute metabolic encephalopathy: Etiology is not clear.  Urinalysis negative.  CT head negative. MRI of brain has motion degradation, but showed questionable small acute to subacute left corona radiata infarct versus artifact.  I consulted Dr. Cheral Marker of neurology.  Dr. Cheral Marker reviewed MRI of brain, he did not think patient has stroke.  Potential differential diagnosis is dementia with behavior disturbance.  -Placed on telemetry bed for position -Frequent neurochecks -As needed haldol for agitation -check Vitamin B12 level and RPR -Increase Synthroid dose due to elevated TSH  Hypothyroidism: TSH 7.148.  Per literature, TSH 1.0-7.0 is normal range for patient older than 86 year old.  Her TSH is slightly higher than 7. -Increase Synthroid dosing from 50 to 75 mcg daily -Need to follow-up with PCP and repeat TSH level in 6 weeks  Hypertension -IV hydralazine as needed -Hold home atenolol due to bradycardia -Hold HCTZ since patient may not be able to eat normally -Continue home amlodipine, lisinopril  Alzheimer disease ([place]) -Continue galantamine  Fall -Fall precaution -PT/OT  Atrial fibrillation, chronic ([place]): Patient's not taking anticoagulants, likely due to risk of fall -Hold atenolol due to bradycardia  HLD (hyperlipidemia) -Postop  Depression -Lexapro        DVT ppx: SQ Lovenox  Code Status: DNR per her sister who is POA  Family Communication: Yes, patient's sister  at bed side.       Disposition Plan:  Anticipate discharge back to previous environment  Consults called: Dr. Cheral Marker of neuro  Admission status and Level of care: Telemetry Medical:    for obs       Severity of Illness:  The appropriate patient status for this patient is OBSERVATION. Observation status is judged to be reasonable and necessary in order to provide the required intensity of service to ensure the patient's safety. The patient's presenting symptoms, physical exam findings, and initial radiographic and laboratory data in the context of their medical condition is felt to place them at decreased risk for further clinical deterioration. Furthermore, it is anticipated that the patient will be medically stable for discharge from the hospital within 2 midnights of admission.        Date of Service 08/31/2021    Ivor Costa [facility]   If 7PM-7AM, please contact night-coverage [email or web address] 08/31/2021, 3:43 PM

## 2021-08-31 NOTE — ED Notes (Signed)
Secure msg sent to Fatima Blank, RN for ED to [employer]. ?

## 2021-08-31 NOTE — ED Notes (Signed)
Pt repeatedly trying to get OOB; unable to be redirected by this RN. This RN sent secure msg to Dr. Blaine Hamper. Orders received. ?

## 2021-08-31 NOTE — ED Notes (Signed)
Secure msg sent to Floyde Parkins, RN for ED to [facility]. ?

## 2021-08-31 NOTE — Progress Notes (Signed)
Pt was given haldol before arrival to floor.  Pt not alert enough to fully complete NIHSS or swallow screen.  Pt in bed resting and safety sitter is in room with pt.  NIHSS is q2h so will reassess ability to complete stroke scale as soon as possible.  Pt remains NPO at this time. ?

## 2021-08-31 NOTE — ED Provider Notes (Signed)
? ?[facility] ?Provider Note ? ? ? Event Date/Time  ? First MD Initiated Contact with Patient 08/31/21 1112   ?  (approximate) ? ? ?History  ? ?Fall ? ? ?HPI ? ?Leslie Osborn is a 88 y.o. female   with past medical history of Alzheimer's disease, bladder cancer, colon cancer, paroxysmal A-fib on atenolol not anticoagulated, HTN, hypothyroidism, and RLS who presents for evaluation after an unwitnessed fall at her nursing facility earlier today.  It seems her daughter wanted her to get checked out.  Patient has no complaints but is not able to find any history and is unclear if his baseline or slightly worse than baseline on arrival.  She states "I do not know" to all questions including what is your name. ? ? ?Physical Exam  ?Triage Vital Signs: ?ED Triage Vitals  ?Enc Vitals Group  ?   BP   ?   Pulse   ?   Resp   ?   Temp   ?   Temp src   ?   SpO2   ?   Weight   ?   Height   ?   Head Circumference   ?   Peak Flow   ?   Pain Score   ?   Pain Loc   ?   Pain Edu?   ?   Excl. in [place]?   ? ? ?Most recent vital signs: ?Vitals:  ? 08/31/21 1230 08/31/21 1300  ?BP: (!) 112/53 (!) 147/96  ?Pulse: (!) 47 (!) 104  ?Resp: (!) 23 13  ?Temp:    ?SpO2: 95% 98%  ? ? ?General: Awake, no distress.  PERRLA.  EOMI.  No tenderness step-offs deformities over the C/T/L-spine. ?CV:  Good peripheral perfusion.  2+ radial pulses.  No significant murmur. ?Resp:  Normal effort.  Clear bilaterally. ?Abd:  No distention.  Soft. ?Other:  No apparent significant tenderness over the bilateral shoulders, elbows, wrists, hips, knees or ankles.  Patient moves all extremities spontaneously and on command.  Sensation seems intact to light touch throughout all extremities.  There is a small abrasion over the proximal right forearm just distal to the lateral aspect of the right elbow.  Is also an abrasion over the more distal aspect of the left forearm. ? ? ?ED Results / Procedures / Treatments  ?Labs ?(all labs ordered are listed,  but only abnormal results are displayed) ?Labs Reviewed  ?URINALYSIS, COMPLETE (UACMP) WITH MICROSCOPIC - Abnormal; Notable for the following components:  ?    Result Value  ? Color, Urine YELLOW (*)   ? APPearance CLEAR (*)   ? Bacteria, UA RARE (*)   ? All other components within normal limits  ?BASIC METABOLIC PANEL - Abnormal; Notable for the following components:  ? Sodium 134 (*)   ? Chloride 97 (*)   ? Glucose, Bld 113 (*)   ? All other components within normal limits  ?TSH - Abnormal; Notable for the following components:  ? TSH 7.148 (*)   ? All other components within normal limits  ?RESP PANEL BY RT-PCR (FLU A&B, COVID) ARPGX2  ?CBC WITH DIFFERENTIAL/PLATELET  ?MAGNESIUM  ?T4, FREE  ?TROPONIN I (HIGH SENSITIVITY)  ? ? ? ?EKG ? ?ECG shows A-fib with a rate of 44, incomplete right bundle branch block without evidence of acute ischemia.  Some artifact in lateral leads. ? ? ?RADIOLOGY ? ? ?CT head and C-spine interpreted by myself without evidence of acute skull fracture, intracranial  hemorrhage, edema, mass effect or acute C-spine injury.  There is evidence of severe left foraminal stenosis as well as aortic at this also reviewed radiologist interpretation and agree with their findings including some fairly severe left foraminal stenosis at C5 and C6 as well as evidence of aortic atherosclerosis and emphysema.  No other acute process on radiology interpretation. ? ?Plan, left forearm and wrist on my interpretation without evidence of fracture or dislocation.  Also reviewed radiology interpretation and agree with the findings of same. ? ? ? ? ?PROCEDURES: ? ?Critical Care performed: No ? ?.1-3 Lead EKG Interpretation ?Performed by: Lucrezia Starch, MD ?Authorized by: Lucrezia Starch, MD  ? ?  Interpretation: abnormal   ?  ECG rate assessment: bradycardic   ?  Rhythm: atrial fibrillation   ? ?The patient is on the cardiac monitor to evaluate for evidence of arrhythmia and/or significant heart rate  changes. ? ? ?MEDICATIONS ORDERED IN ED: ?Medications  ?haloperidol lactate (HALDOL) injection 2 mg (2 mg Intramuscular Given 08/31/21 1508)  ? ? ? ?IMPRESSION / MDM / ASSESSMENT AND PLAN / ED COURSE  ?I reviewed the triage vital signs and the nursing notes. ?             ?               ? ?Differential diagnosis includes, but is not limited to mechanical versus syncopal fall with subsequent injuries including possible occult intracranial injury.  Patient does not have any other significant pain or traumatic findings on exam aside from a very superficial abrasion. ? ?CT head and C-spine interpreted by myself without evidence of acute skull fracture, intracranial hemorrhage, edema, mass effect or acute C-spine injury.  There is evidence of severe left foraminal stenosis as well as aortic at this also reviewed radiologist interpretation and agree with their findings including some fairly severe left foraminal stenosis at C5 and C6 as well as evidence of aortic atherosclerosis and emphysema.  No other acute process on radiology interpretation. ? ?Plan, left forearm and wrist on my interpretation without evidence of fracture or dislocation.  Also reviewed radiology interpretation and agree with the findings of same. ? ?CBC without leukocytosis or acute anemia.  BMP without significant electrolyte or metabolic derangements.  Nonelevated troponin and ECG is not suggestive of ACS.  Patient is known to be bradycardic possibly relating to fall today.  She is not on atenolol on review of records.  TSH is slightly elevated.  We will send a free T4.  Magnesium is unremarkable.  UA not suggestive of cystitis. ? ?Undergoing work-up in emergency room patient's sister arrived to emergency room and states that she has way of her baseline.  She is most worried about a stroke.  I do not appreciate any acute focal neurological deficits patient does seem fairly confused.  We will obtain an MRI to assess for possible subtle acute CVA.  I  will plan admit to medicine service for further evaluation management of acute altered mental status of unclear etiology at this time.. ? ? ?FINAL CLINICAL IMPRESSION(S) / ED DIAGNOSES  ? ?Final diagnoses:  ?Fall, initial encounter  ?Altered mental status, unspecified altered mental status type  ?Atrial fibrillation, unspecified type ([place])  ? ? ? ?Rx / DC Orders  ? ?ED Discharge Orders   ? ? None  ? ?  ? ? ? ?Note:  This document was prepared using Dragon voice recognition software and may include unintentional dictation errors. ?  ?Hulan Saas  P, MD ?08/31/21 1516 ? ?

## 2021-08-31 NOTE — ED Notes (Signed)
Pt fell at 0740 today, per [facility] staff. Per staff, pt did not hit head, no LOC. Pts daughter visited around 1000 & noticed that pt was unable to state her own name & also did not recognize daughter, which is not her baseline. Pt does have hx dementia. Pt able to state her name & does recognize daughter currently; disoriented to place, time, situation. ?

## 2021-08-31 NOTE — ED Notes (Signed)
Dr Niu at bedside 

## 2021-08-31 NOTE — ED Notes (Signed)
Pt assisted to toilet & back to bed with 2 RN's. ?

## 2021-09-01 LAB — LIPID PANEL
Cholesterol: 131 mg/dL (ref 0–200)
HDL: 69 mg/dL (ref >40)
LDL Cholesterol: 54 mg/dL (ref 0–99)
Total CHOL/HDL Ratio: 1.9 RATIO
Triglycerides: 38 mg/dL (ref <150)
VLDL: 8 mg/dL (ref 0–40)

## 2021-09-01 LAB — GLUCOSE, CAPILLARY: Glucose-Capillary: 81 mg/dL (ref 70–99)

## 2021-09-01 LAB — VITAMIN B12: Vitamin B-12: 205 pg/mL (ref 180–914)

## 2021-09-01 LAB — RPR: RPR Ser Ql: NONREACTIVE

## 2021-09-01 MED ORDER — METOPROLOL TARTRATE 25 MG PO TABS
12.5000 mg | ORAL_TABLET | Freq: Two times a day (BID) | ORAL | Status: DC
  Administered 2021-09-01 – 2021-09-02 (×3): 12.5 mg via ORAL
  Filled 2021-09-01 (×3): qty 1

## 2021-09-01 MED ORDER — QUETIAPINE FUMARATE 25 MG PO TABS
25.0000 mg | ORAL_TABLET | Freq: Every day | ORAL | Status: DC
  Administered 2021-09-01: 25 mg via ORAL
  Filled 2021-09-01: qty 1

## 2021-09-01 NOTE — Progress Notes (Addendum)
[facility] [place] NAME: Leslie Osborn    MR#:  [id number]  DATE OF BIRTH:  08/20/1925  SUBJECTIVE:  patient came in from her memory care unit after she had a fall yesterday. Workup so far negative for any fracture or trauma. She has a bruise on her left forearm. Patient has increasing confusion and intermittently not recognizing her sister. She has advanced dementia. Sitter in the room earlier. Patient did receive Haldol secondary to agitation yesterday. Purse sister patient had a rough day in the ER and on the floor. Currently sleeping. Does wake up however unable to hold a meaningful conversation due to dementia  no fever. Did eat some breakfast this morning. Sister tells me she uses walker to ambulate at the facility    VITALS:  Blood pressure (!) 166/85, pulse 79, temperature 98.1 F (36.7 C), resp. rate 19, height '5\' 4"'$  (1.626 m), weight 82.6 kg, SpO2 96 %.  PHYSICAL EXAMINATION:  Limited GENERAL:  88 y.o.-year-old patient lying in the bed with no acute distress.  LUNGS: Normal breath sounds bilaterally, no wheezing, rales, rhonchi.  CARDIOVASCULAR: S1, S2 normal. No murmurs, rubs, or gallops.  EXTREMITIES: No  edema b/l.   Left hand bruise + NEUROLOGIC: nonfocal  patient is confused due to dementia  LABORATORY PANEL:  CBC Recent Labs  Lab 08/31/21 1132  WBC 7.1  HGB 13.1  HCT 40.3  PLT 162    Chemistries  Recent Labs  Lab 08/31/21 1132  NA 134*  K 4.5  CL 97*  CO2 30  GLUCOSE 113*  BUN 18  CREATININE 0.87  CALCIUM 9.3  MG 1.9   Cardiac Enzymes No results for input(s): TROPONINI in the last 168 hours. RADIOLOGY:  DG Forearm Left  Result Date: 08/31/2021 CLINICAL DATA:  Trauma, fall EXAM: LEFT FOREARM - 2 VIEW COMPARISON:  None. FINDINGS: No fracture or dislocation is seen. There is 2 mm metallic density in the soft tissues medial to the distal ulna, possibly residual from previous injury. There are scattered  arterial calcifications in the soft tissues. IMPRESSION: No recent fracture or dislocation is seen in the left forearm. Electronically Signed   By: Elmer Picker M.D.   On: 08/31/2021 13:54   DG Wrist Complete Left  Result Date: 08/31/2021 CLINICAL DATA:  Trauma, fall EXAM: LEFT WRIST - COMPLETE 3+ VIEW COMPARISON:  None. FINDINGS: No recent fracture or dislocation is seen. Severe degenerative changes are noted in first carpometacarpal joint with bony spurs and adjacent loose bodies. Degenerative changes are noted in first metacarpophalangeal joint. There is 2 mm metallic density medial to the distal ulna possibly residual from previous injury. Arterial calcifications are seen in the soft tissues. IMPRESSION: No recent fracture or dislocation is seen in the left wrist. Severe degenerative changes are noted in first carpometacarpal joint. Electronically Signed   By: Elmer Picker M.D.   On: 08/31/2021 13:53   CT HEAD WO CONTRAST (5MM)  Result Date: 08/31/2021 CLINICAL DATA:  Head trauma, minor (Age >= 65y); Neck trauma (Age >= 65y) EXAM: CT HEAD WITHOUT CONTRAST CT CERVICAL SPINE WITHOUT CONTRAST TECHNIQUE: Multidetector CT imaging of the head and cervical spine was performed following the standard protocol without intravenous contrast. Multiplanar CT image reconstructions of the cervical spine were also generated. RADIATION DOSE REDUCTION: This exam was performed according to the departmental dose-optimization program which includes automated exposure control, adjustment of the mA and/or kV according to patient size and/or use of iterative  reconstruction technique. COMPARISON:  CT head 01/16/2021. FINDINGS: CT HEAD FINDINGS Brain: No evidence of acute infarction, hemorrhage, hydrocephalus, extra-axial collection or mass lesion/mass effect. Chronic mesial temporal atrophy with associated ex vacuo dilation of the temporal horns. Patchy white matter hypoattenuation, nonspecific but compatible with  chronic microvascular ischemic disease. Vascular: Calcific intracranial atherosclerosis. Skull: No evidence of acute fracture. Sinuses/Orbits: Visualized sinuses are clear.  Unremarkable orbits. Other: No mastoid effusions. CT CERVICAL SPINE FINDINGS Mildly motion limited study. Alignment: Straightening of the normal cervical lordosis. Slight retrolisthesis of C5 on C6, favor degenerative given degenerative changes at this level. Otherwise, no substantial sagittal subluxation. Skull base and vertebrae: Vertebral body heights are maintained. No evidence of acute fracture. Soft tissues and spinal canal: No prevertebral fluid or swelling. No visible canal hematoma. Disc levels: Degenerative changes greatest at C5-C6 where there is left eccentric disc height loss, endplate spurring and facet/uncovertebral hypertrophy. Resulting potentially severe left foraminal stenosis. Upper chest: Visualized lung apices are clear. Centrilobular emphysema. Aortic atherosclerosis. IMPRESSION: CT head: 1. No evidence of acute intracranial abnormality. 2. Chronic mesial temporal atrophy, which can be seen with Alzheimer's disease. CT cervical spine: 1. No evidence of acute fracture or traumatic malalignment on this mildly motion limited study. 2. Left eccentric C5-C6 degenerative change with potentially severe left foraminal stenosis. An MRI could better evaluate if clinically indicated. 3. Aortic Atherosclerosis (ICD10-I70.0) and Emphysema (ICD10-J43.9). Electronically Signed   By: Margaretha Sheffield M.D.   On: 08/31/2021 11:57   CT Cervical Spine Wo Contrast  Result Date: 08/31/2021 CLINICAL DATA:  Head trauma, minor (Age >= 65y); Neck trauma (Age >= 65y) EXAM: CT HEAD WITHOUT CONTRAST CT CERVICAL SPINE WITHOUT CONTRAST TECHNIQUE: Multidetector CT imaging of the head and cervical spine was performed following the standard protocol without intravenous contrast. Multiplanar CT image reconstructions of the cervical spine were also  generated. RADIATION DOSE REDUCTION: This exam was performed according to the departmental dose-optimization program which includes automated exposure control, adjustment of the mA and/or kV according to patient size and/or use of iterative reconstruction technique. COMPARISON:  CT head 01/16/2021. FINDINGS: CT HEAD FINDINGS Brain: No evidence of acute infarction, hemorrhage, hydrocephalus, extra-axial collection or mass lesion/mass effect. Chronic mesial temporal atrophy with associated ex vacuo dilation of the temporal horns. Patchy white matter hypoattenuation, nonspecific but compatible with chronic microvascular ischemic disease. Vascular: Calcific intracranial atherosclerosis. Skull: No evidence of acute fracture. Sinuses/Orbits: Visualized sinuses are clear.  Unremarkable orbits. Other: No mastoid effusions. CT CERVICAL SPINE FINDINGS Mildly motion limited study. Alignment: Straightening of the normal cervical lordosis. Slight retrolisthesis of C5 on C6, favor degenerative given degenerative changes at this level. Otherwise, no substantial sagittal subluxation. Skull base and vertebrae: Vertebral body heights are maintained. No evidence of acute fracture. Soft tissues and spinal canal: No prevertebral fluid or swelling. No visible canal hematoma. Disc levels: Degenerative changes greatest at C5-C6 where there is left eccentric disc height loss, endplate spurring and facet/uncovertebral hypertrophy. Resulting potentially severe left foraminal stenosis. Upper chest: Visualized lung apices are clear. Centrilobular emphysema. Aortic atherosclerosis. IMPRESSION: CT head: 1. No evidence of acute intracranial abnormality. 2. Chronic mesial temporal atrophy, which can be seen with Alzheimer's disease. CT cervical spine: 1. No evidence of acute fracture or traumatic malalignment on this mildly motion limited study. 2. Left eccentric C5-C6 degenerative change with potentially severe left foraminal stenosis. An MRI  could better evaluate if clinically indicated. 3. Aortic Atherosclerosis (ICD10-I70.0) and Emphysema (ICD10-J43.9). Electronically Signed   By: Jamesetta So.D.  On: 08/31/2021 11:57   MR BRAIN WO CONTRAST  Result Date: 08/31/2021 CLINICAL DATA:  Mental status change.  Confusion.  Fall. EXAM: MRI HEAD WITHOUT CONTRAST TECHNIQUE: Multiplanar, multiecho pulse sequences of the brain and surrounding structures were obtained without intravenous contrast. COMPARISON:  Head CT 08/31/2021 FINDINGS: The study is moderately motion degraded. Brain: There is a small amount of mild trace diffusion weighted signal hyperintensity in the left corona radiata with possibly mildly reduced ADC, indeterminate for acute to subacute infarct versus artifact/shine through. No acute cortically based infarct is present. A chronic microhemorrhage is noted in the left occipital lobe. Patchy to confluent T2 hyperintensities in the cerebral white matter and pons are nonspecific but compatible with moderate chronic small vessel ischemic disease. Chronic lacunar infarcts are noted in the thalami. There is cerebral atrophy including advanced bilateral mesial temporal lobe volume loss. No mass, midline shift, or extra-axial fluid collection is evident. Vascular: Major intracranial vascular flow voids are preserved. Skull and upper cervical spine: Unremarkable bone marrow signal. Sinuses/Orbits: Bilateral cataract extraction. Paranasal sinuses and mastoid air cells are clear. Other: None. IMPRESSION: 1. Motion degraded examination. 2. Questionable small acute to subacute left corona radiata infarct versus artifact. 3. Moderate chronic small vessel ischemic disease. 4. Advanced bilateral temporal lobe atrophy. Electronically Signed   By: Logan Bores M.D.   On: 08/31/2021 15:51    Assessment and Plan SYDNEY HASTEN is a 88 y.o. female with medical history significant of hypertension, hyperlipidemia, hypothyroidism, depression, RLS, colon  cancer (s/p of chemotherapy), atrial fibrillation not on anticoagulants, Alzheimer's disease, breast cancer (carcinoma in situ), who presents with altered mental status and fall.  Acute metabolic encephalopathy suspected due to progression of Alzheimer's dementia. History of fall nontraumatic -- CT head negative -- MRI of the brain has motion to gradation. Dr. Cheral Marker from neurology reviewed MRI did not think patient has stroke. -- Appears this is likely dementia with behavioral disturbance -- no source of infection -- vitals stable -- continue Galantamine -- avoid benzodiazepines and Haldol  Hypothyroidism -- TSH 7.1 -- increased dose of Synthroid to 75 g  Hypertension asymptomatic bradycardia/history of a fib chronic -- switch to low-dose metoprolol -- continue amlodipine and lisinopril\  long discussion with sister Wilma at bedside. Sister is concerned about financial constraints long-term and has had discussion with TOC to see if patient can get reasonably inexpensive place. TOC is discussed with care patrol. In the meantime patient will discharged to [facility] tomorrow.    Procedures: none Family communication : sister Wilma at bedside Consults : none CODE STATUS: DNR DVT Prophylaxis : enoxaparin Level of care: Telemetry Medical Status is: Observation The patient remains OBS appropriate and will d/c before 2 midnights.    TOTAL TIME TAKING CARE OF THIS PATIENT: 25 minutes.  >50% time spent on counselling and coordination of care  Note: This dictation was prepared with Dragon dictation along with smaller phrase technology. Any transcriptional errors that result from this process are unintentional.  Fritzi Mandes M.D    [facility]   CC: Primary care physician; Albina Billet, MD

## 2021-09-01 NOTE — Progress Notes (Signed)
PT Cancellation Note ? ?Patient Details ?Name: Leslie Osborn ?MRN: [id number] ?DOB: [birth year] ? ? ?Cancelled Treatment:    Reason Eval/Treat Not Completed: Fatigue/lethargy limiting ability to participate;Patient's level of consciousness (Per chart/RN, pt agitated requiring Rx assisted calming. Pt not following commands well, with increased impulsivity, subsequently somnolent. Will defer evaluation to later date/time.) ? ?3:07 PM, 09/01/21 ?Etta Grandchild, PT, DPT ?Physical Therapist - [place] ?[facility]  ?[id number] (ASCOM)  ? ? ?[name] ?09/01/2021, 3:07 PM ?

## 2021-09-01 NOTE — [place] FL2 (Signed)
?[place] MEDICAID FL2 LEVEL OF CARE SCREENING TOOL  ?  ? ?IDENTIFICATION  ?Patient Name: ?Leslie Osborn Birthdate: [birth year] Sex: female Admission Date (Current Location): ?08/31/2021  ?[place] and [place] Number: ? [place] ?  Facility and Address:  ?  ?     Provider Number: ?[id number]  ?Attending Physician Name and Address:  ?Fritzi Mandes, MD ? Relative Name and Phone Number:  ?Darryll Capers sister [phone] ?   ?Current Level of Care: ?Hospital Recommended Level of Care: ?Memory Care Prior Approval Number: ?  ? ?Date Approved/Denied: ?  PASRR Number: ?  ? ?Discharge Plan: ?Other (Comment) (memory care) ?  ? ?Current Diagnoses: ?Patient Active Problem List  ? Diagnosis Date Noted  ? Acute metabolic encephalopathy 95/62/1308  ? Hypothyroidism 08/31/2021  ? Alzheimer disease ([place]) 08/31/2021  ? Fall 08/31/2021  ? Atrial fibrillation, chronic ([place]) 08/31/2021  ? HLD (hyperlipidemia) 08/31/2021  ? HTN (hypertension) 08/31/2021  ? Depression 08/31/2021  ? Personal history of breast cancer 12/11/2012  ? Hypertension 1991  ? ? ?Orientation RESPIRATION BLADDER Height & Weight   ?  ?Self ? Normal Continent, External catheter Weight: 82.6 kg ?Height:  '5\' 4"'$  (162.6 cm)  ?BEHAVIORAL SYMPTOMS/MOOD NEUROLOGICAL BOWEL NUTRITION STATUS  ?    Continent Diet (see DC summary)  ?AMBULATORY STATUS COMMUNICATION OF NEEDS Skin   ?Limited Assist Verbally Normal ?  ?  ?  ?    ?     ?     ? ? ?Personal Care Assistance Level of Assistance  ?Bathing, Feeding, Dressing Bathing Assistance: Limited assistance ?Feeding assistance: Limited assistance ?Dressing Assistance: Limited assistance ?   ? ?Functional Limitations Info  ?    ?  ?   ? ? ?SPECIAL CARE FACTORS FREQUENCY  ?    ?  ?  ?  ?  ?  ?  ?   ? ? ?Contractures Contractures Info: Not present  ? ? ?Additional Factors Info  ?Code Status, Allergies Code Status Info: DNR ?Allergies Info: Donepezil ?  ?  ?  ?   ? ?Current Medications (09/01/2021):  This is the current hospital active medication  list ?Current Facility-Administered Medications  ?Medication Dose Route Frequency Provider Last Rate Last Admin  ? acetaminophen (TYLENOL) suppository 650 mg  650 mg Rectal Q6H PRN Ivor Costa, MD      ? acetaminophen (TYLENOL) tablet 650 mg  650 mg Oral Q6H PRN Ivor Costa, MD      ? amLODipine (NORVASC) tablet 2.5 mg  2.5 mg Oral Daily Ivor Costa, MD   2.5 mg at 09/01/21 1033  ? aspirin EC tablet 81 mg  81 mg Oral Daily Ivor Costa, MD   81 mg at 09/01/21 1033  ? enoxaparin (LOVENOX) injection 40 mg  40 mg Subcutaneous Q24H Ivor Costa, MD   40 mg at 08/31/21 2208  ? escitalopram (LEXAPRO) tablet 5 mg  5 mg Oral Daily Ivor Costa, MD   5 mg at 09/01/21 1034  ? galantamine (RAZADYNE) tablet 12 mg  12 mg Oral BID Lucrezia Starch, MD   12 mg at 09/01/21 1033  ? levothyroxine (SYNTHROID) tablet 75 mcg  75 mcg Oral QAC breakfast Ivor Costa, MD      ? lisinopril (ZESTRIL) tablet 40 mg  40 mg Oral Daily Ivor Costa, MD   40 mg at 09/01/21 1033  ? melatonin tablet 2.5 mg  2.5 mg Oral QHS Ivor Costa, MD      ? metoprolol tartrate (LOPRESSOR) tablet 12.5 mg  12.5  mg Oral BID Fritzi Mandes, MD   12.5 mg at 09/01/21 1304  ? multivitamin-lutein (OCUVITE-LUTEIN) capsule 2 capsule  2 capsule Oral Daily Ivor Costa, MD   2 capsule at 09/01/21 1033  ? ondansetron (ZOFRAN) injection 4 mg  4 mg Intravenous Q8H PRN Ivor Costa, MD      ? simvastatin (ZOCOR) tablet 20 mg  20 mg Oral QPM Ivor Costa, MD      ? trolamine salicylate (ASPERCREME) 10 % cream 1 application.  1 application. Topical BID PRN Ivor Costa, MD      ? ? ? ?Discharge Medications: ?Please see discharge summary for a list of discharge medications. ? ?Relevant Imaging Results: ? ?Relevant Lab Results: ? ? ?Additional Information ?[id number] SS# ? ?Conception Oms, RN ? ? ? ? ?

## 2021-09-01 NOTE — TOC Progression Note (Signed)
Transition of Care (TOC) - Progression Note  ? ? ?Patient Details  ?Name: Leslie Osborn ?MRN: [id number] ?Date of Birth: January 16, 1926 ? ?Transition of Care (TOC) CM/SW Contact  ?Conception Oms, RN ?Phone Number: ?09/01/2021, 1:10 PM ? ?Clinical Narrative:   Spoke with the Nurse beth at [facility]  ?They will accept the patient back tomorrow, I spoke with Darryll Capers the patient's sister, she is agreeable to go back to [facility] memory care tomorrow and would like EMS transport, [place] with Care patrol to work with them outpatient to find long term placement ? ? ?Expected Discharge Plan: Memory Care ?Barriers to Discharge: Continued Medical Work up ? ?Expected Discharge Plan and Services ?Expected Discharge Plan: Memory Care ?  ?Discharge Planning Services: CM Consult ?  ?Living arrangements for the past 2 months: [place] ?                ?  ?  ?  ?  ?  ?  ?  ?  ?  ?  ? ? ?Social Determinants of Health (SDOH) Interventions ?  ? ?Readmission Risk Interventions ?No flowsheet data found. ? ?

## 2021-09-01 NOTE — TOC Progression Note (Addendum)
Transition of Care (TOC) - Progression Note  ? ? ?Patient Details  ?Name: Leslie Osborn ?MRN: [id number] ?Date of Birth: 1926/03/06 ? ?Transition of Care (TOC) CM/SW Contact  ?Conception Oms, RN ?Phone Number: ?09/01/2021, 10:22 AM ? ?Clinical Narrative:   Patient from [facility] to monitor for needs ans assist ?[facility], she would like to have the patient go to long term and possibly family care home so that the patient will have more personal care, She would like to speak with Andee Poles from [employer],  ?I called Andee Poles and spoke to her and she will reach out to [facility]  to assist with Long term Care placement ? ?  ?  ? ?Expected Discharge Plan and Services ?  ?  ?  ?  ?  ?                ?  ?  ?  ?  ?  ?  ?  ?  ?  ?  ? ? ?Social Determinants of Health (SDOH) Interventions ?  ? ?Readmission Risk Interventions ?No flowsheet data found. ? ?

## 2021-09-01 NOTE — Plan of Care (Signed)
?  Problem: Education: ?Goal: Knowledge of General Education information will improve ?Description: Including pain rating scale, medication(s)/side effects and non-pharmacologic comfort measures ?Outcome: Progressing ?  ?Problem: Health Behavior/Discharge Planning: ?Goal: Ability to manage health-related needs will improve ?Outcome: Progressing ?  ?Problem: Clinical Measurements: ?Goal: Ability to maintain clinical measurements within normal limits will improve ?Outcome: Progressing ?Goal: Will remain free from infection ?Outcome: Progressing ?Goal: Diagnostic test results will improve ?Outcome: Progressing ?Goal: Respiratory complications will improve ?Outcome: Progressing ?Goal: Cardiovascular complication will be avoided ?Outcome: Progressing ?  ?Problem: Activity: ?Goal: Risk for activity intolerance will decrease ?Outcome: Progressing ?  ?Problem: Nutrition: ?Goal: Adequate nutrition will be maintained ?Outcome: Progressing ?  ?Problem: Elimination: ?Goal: Will not experience complications related to bowel motility ?Outcome: Progressing ?Goal: Will not experience complications related to urinary retention ?Outcome: Progressing ?  ?Problem: Pain Managment: ?Goal: General experience of comfort will improve ?Outcome: Progressing ?  ?Problem: Safety: ?Goal: Ability to remain free from injury will improve ?Outcome: Progressing ?  ?Problem: Skin Integrity: ?Goal: Risk for impaired skin integrity will decrease ?Outcome: Progressing ?  ?Problem: Education: ?Goal: Knowledge of disease or condition will improve ?Outcome: Progressing ?Goal: Knowledge of secondary prevention will improve (SELECT ALL) ?Outcome: Progressing ?Goal: Knowledge of patient specific risk factors will improve (INDIVIDUALIZE FOR PATIENT) ?Outcome: Progressing ?  ?Problem: Coping: ?Goal: Will verbalize positive feelings about self ?Outcome: Progressing ?Goal: Will identify appropriate support needs ?Outcome: Progressing ?  ?Problem: Health  Behavior/Discharge Planning: ?Goal: Ability to manage health-related needs will improve ?Outcome: Progressing ?  ?Problem: Self-Care: ?Goal: Ability to participate in self-care as condition permits will improve ?Outcome: Progressing ?Goal: Verbalization of feelings and concerns over difficulty with self-care will improve ?Outcome: Progressing ?Goal: Ability to communicate needs accurately will improve ?Outcome: Progressing ?  ?Problem: Nutrition: ?Goal: Risk of aspiration will decrease ?Outcome: Progressing ?  ?

## 2021-09-01 NOTE — Evaluation (Signed)
Occupational Therapy Evaluation ?Patient Details ?Name: Leslie Osborn ?MRN: [id number] ?DOB: 09-Mar-1926 ?Today's Date: 09/01/2021 ? ? ?History of Present Illness Leslie Osborn is a 88 y.o. female with medical history significant of hypertension, hyperlipidemia, hypothyroidism, depression, RLS, colon cancer (s/p of chemotherapy), atrial fibrillation not on anticoagulants, Alzheimer's disease, breast cancer (carcinoma in situ), who presents with altered mental status and fall.  ? ?Clinical Impression ?   ?Leslie Osborn was seen for OT evaluation this date. Prior to hospital admission, pt was resident at [facility], [place] for mobility and ADLs. Pt presents to acute OT demonstrating impaired ADL performance and functional mobility 2/2 decreased activity tolerance and functional strength/balance deficits. Pt currently requires SBA + RW for toilet t/f and grooming standing sinkside - cues for use of soap. MIN A standing from low commode height, no assist to stand from eelvated BSC height. Pt would benefit from skilled OT to address noted impairments and functional limitations (see below for any additional details). Upon hospital discharge, recommend HHOT to maximize pt safety and return to PLOF. ?   ? ?Recommendations for follow up therapy are one component of a multi-disciplinary discharge planning process, led by the attending physician.  Recommendations may be updated based on patient status, additional functional criteria and insurance authorization.  ? ?Follow Up Recommendations ? Home health OT  ?  ?Assistance Recommended at Discharge Intermittent Supervision/Assistance  ?Patient can return home with the following A little help with bathing/dressing/bathroom;Help with stairs or ramp for entrance ? ?  ?Functional Status Assessment ? Patient has had a recent decline in their functional status and demonstrates the ability to make significant improvements in function in a reasonable and predictable amount  of time.  ?Equipment Recommendations ? BSC/3in1  ?  ?Recommendations for Other Services   ? ? ?  ?Precautions / Restrictions Precautions ?Precautions: Fall ?Restrictions ?Weight Bearing Restrictions: No  ? ?  ? ?Mobility Bed Mobility ?Overal bed mobility: Needs Assistance ?Bed Mobility: Supine to Sit ?  ?  ?Supine to sit: Min guard ?  ?  ?General bed mobility comments: cues for bed rail use ?  ? ?Transfers ?Overall transfer level: Needs assistance ?Equipment used: Rolling walker (2 wheels) ?Transfers: Sit to/from Stand ?Sit to Stand: Min guard ?  ?  ?  ?  ?  ?General transfer comment: from [facility] height, MIN A from low toilet ?  ? ?  ?Balance Overall balance assessment: Needs assistance ?Sitting-balance support: No upper extremity supported, Feet supported ?Sitting balance-Leahy Scale: Good ?  ?  ?Standing balance support: No upper extremity supported, During functional activity ?Standing balance-Leahy Scale: Fair ?  ?  ?  ?  ?  ?  ?  ?  ?  ?  ?  ?  ?   ? ?ADL either performed or assessed with clinical judgement  ? ?ADL Overall ADL's : Needs assistance/impaired ?  ?  ?  ?  ?  ?  ?  ?  ?  ?  ?  ?  ?  ?  ?  ?  ?  ?  ?  ?General ADL Comments: SBA + RW for toilet t/f and grooming standing sinkside - cues for use of soap. MIN A standing from low commode height, no assist to stand from eelvated BSC height.  ? ? ? ? ?Pertinent Vitals/Pain Pain Assessment ?Pain Assessment: No/denies pain  ? ? ? ?Hand Dominance Right ?  ?Extremity/Trunk Assessment Upper Extremity Assessment ?Upper Extremity Assessment: Overall [facility]  for tasks assessed ?  ?Lower Extremity Assessment ?Lower Extremity Assessment: Generalized weakness ?  ?  ?  ?Communication Communication ?Communication: No difficulties ?  ?Cognition Arousal/Alertness: Awake/alert ?Behavior During Therapy: [facility] for tasks assessed/performed ?Overall Cognitive Status: History of cognitive impairments - at baseline ?  ?  ?  ?  ?  ?  ?  ?  ?  ?  ?  ?  ?  ?  ?  ?  ?General Comments: Leslie Osborn  at bedside reports baseline ?  ?  ?General Comments    ? ?  ? ?Home Living Family/patient expects to be discharged to:: Assisted living ?  ?  ?  ?  ?  ?  ?  ?  ?  ?  ?  ?  ?  ?  ?Home Equipment: Conservation officer, nature (2 wheels) ?  ?Additional Comments: [place] ?  ? ?  ?Prior Functioning/Environment Prior Level of Function : Needs assist ? Cognitive Assist : ADLs (cognitive) ?  ?  ?  ?  ?  ?  ?ADLs Comments: assist for baths and IADLs ?  ? ?  ?  ?OT Problem List: Decreased strength;Decreased activity tolerance;Impaired balance (sitting and/or standing);Decreased safety awareness ?  ?   ?OT Treatment/Interventions: Self-care/ADL training;Therapeutic exercise;Energy conservation;DME and/or AE instruction;Therapeutic activities;Balance training;Patient/family education  ?  ?OT Goals(Current goals can be found in the care plan section) Acute Rehab OT Goals ?Patient Stated Goal: to go home ?OT Goal Formulation: With patient/family ?Time For Goal Achievement: 09/15/21 ?Potential to Achieve Goals: Good ?ADL Goals ?Pt Will Perform Grooming: with modified independence;standing ?Pt Will Perform Lower Body Dressing: with modified independence;sit to/from stand ?Pt Will Transfer to Toilet: with modified independence;ambulating;regular height toilet  ?OT Frequency: Min 2X/week ?  ? ?Co-evaluation   ?  ?  ?  ?  ? ?  ?AM-PAC OT "6 Clicks" Daily Activity     ?Outcome Measure Help from another person eating meals?: A Little ?Help from another person taking care of personal grooming?: A Little ?Help from another person toileting, which includes using toliet, bedpan, or urinal?: A Little ?Help from another person bathing (including washing, rinsing, drying)?: A Little ?Help from another person to put on and taking off regular upper body clothing?: A Little ?Help from another person to put on and taking off regular lower body clothing?: A Little ?6 Click Score: 18 ?  ?End of Session Equipment Utilized During Treatment: Rolling  walker (2 wheels) ?Nurse Communication: Mobility status ? ?Activity Tolerance: Patient tolerated treatment well ?Patient left: in chair;with call bell/phone within reach;with chair alarm set;with family/visitor present ? ?OT Visit Diagnosis: Other abnormalities of gait and mobility (R26.89)  ?              ?Time: 2542-7062 ?OT Time Calculation (min): 28 min ?Charges:  OT General Charges ?$OT Visit: 1 Visit ?OT Evaluation ?$OT Eval Moderate Complexity: 1 Mod ?OT Treatments ?$Self Care/Home Management : 8-22 mins ? ?Dessie Coma, M.S. OTR/L  ?09/01/21, 3:23 PM  ?ascom [phone] ? ?

## 2021-09-02 DIAGNOSIS — G9341 Metabolic encephalopathy: Secondary | ICD-10-CM | POA: Diagnosis not present

## 2021-09-02 LAB — GLUCOSE, CAPILLARY: Glucose-Capillary: 78 mg/dL (ref 70–99)

## 2021-09-02 LAB — HEMOGLOBIN A1C
Hgb A1c MFr Bld: 5.8 % — ABNORMAL HIGH (ref 4.8–5.6)
Mean Plasma Glucose: 120 mg/dL

## 2021-09-02 MED ORDER — METOPROLOL TARTRATE 25 MG PO TABS: 12.5000 mg | ORAL_TABLET | Freq: Two times a day (BID) | ORAL | 0 refills | Status: AC

## 2021-09-02 MED ORDER — LEVOTHYROXINE SODIUM 75 MCG PO TABS: 75.0000 ug | ORAL_TABLET | Freq: Every day | ORAL | 2 refills | Status: AC

## 2021-09-02 NOTE — Progress Notes (Signed)
EMS transported pt from [facility] to [facility]. ?

## 2021-09-02 NOTE — Progress Notes (Signed)
[place] facility and reported to [facility], [employer]. ?

## 2021-09-02 NOTE — Evaluation (Signed)
Physical Therapy Evaluation ?Patient Details ?Name: Leslie Osborn ?MRN: [id number] ?DOB: [birth year] ?Today's Date: 09/02/2021 ? ?History of Present Illness ? KAISLEY STIVERSON is a 88 y.o. female with medical history significant of hypertension, hyperlipidemia, hypothyroidism, depression, RLS, colon cancer (s/p of chemotherapy), atrial fibrillation not on anticoagulants, Alzheimer's disease, breast cancer (carcinoma in situ), who presents with altered mental status and fall.  ?Clinical Impression ? Pt seen for PT evaluation with sister Rush Landmark) present for session. Pt demonstrates impaired cognition but pt has dementia at baseline; pt is able to follow simple commands with extra time during session. Pt does require min assist for bed mobility & CGA for sit>stand. Pt ambulates 1 lap around nurses station with RW & supervision with gait pattern as noted below. At this time, will recommend HHPT f/u but unsure of how much progress pt can make with therapy 2/2 baseline cognitive deficits.   ?   ? ?Recommendations for follow up therapy are one component of a multi-disciplinary discharge planning process, led by the attending physician.  Recommendations may be updated based on patient status, additional functional criteria and insurance authorization. ? ?Follow Up Recommendations Home health PT ? ?  ?Assistance Recommended at Discharge Frequent or constant Supervision/Assistance  ?Patient can return home with the following ? A little help with walking and/or transfers;A little help with bathing/dressing/bathroom ? ?  ?Equipment Recommendations None recommended by PT (pt already has RW)  ?Recommendations for Other Services ?    ?  ?Functional Status Assessment  (pt with slight decrease in function but impaired cognition limits ability for rehab)  ? ?  ?Precautions / Restrictions Precautions ?Precautions: Fall ?Restrictions ?Weight Bearing Restrictions: No  ? ?  ? ?Mobility ? Bed Mobility ?Overal bed mobility: Needs Assistance ?Bed  Mobility: Supine to Sit, Sit to Supine ?  ?  ?Supine to sit: Min assist, HOB elevated (min assist to upright trunk to sitting EOB with HOB slightly elevated) ?Sit to supine: Supervision, HOB elevated ?  ?  ?  ? ?Transfers ?Overall transfer level: Needs assistance ?Equipment used: Rolling walker (2 wheels) ?Transfers: Sit to/from Stand ?Sit to Stand: Min guard ?  ?  ?  ?  ?  ?General transfer comment: extra time to complete sit>stand from EOB ?  ? ?Ambulation/Gait ?Ambulation/Gait assistance: Supervision ?Gait Distance (Feet): 150 Feet ?Assistive device: Rolling walker (2 wheels) ?Gait Pattern/deviations: Decreased step length - right, Decreased step length - left, Decreased dorsiflexion - right, Decreased dorsiflexion - left, Decreased stride length ?Gait velocity: decreased ?  ?  ?General Gait Details: decreased heel strike BLE, decreased foot clearance BLE, decrease hip & knee flexion during swing phase BLE/ ? ?Stairs ?  ?  ?  ?  ?  ? ?Wheelchair Mobility ?  ? ?Modified Rankin (Stroke Patients Only) ?  ? ?  ? ?Balance Overall balance assessment: Needs assistance ?Sitting-balance support: No upper extremity supported, Feet supported ?Sitting balance-Leahy Scale: Good ?  ?  ?Standing balance support: No upper extremity supported, During functional activity ?Standing balance-Leahy Scale: Fair ?  ?  ?  ?  ?  ?  ?  ?  ?  ?  ?  ?  ?   ? ? ? ?Pertinent Vitals/Pain Pain Assessment ?Pain Assessment: No/denies pain  ? ? ?Home Living Family/patient expects to be discharged to:: Other (Comment) [facility]) ?  ?  ?  ?  ?  ?  ?  ?  ?Home Equipment: Conservation officer, nature (2 wheels) ?Additional Comments: [facility]  ?  ?Prior Function Prior Level of Function : Needs assist ? Cognitive Assist : ADLs (cognitive) ?  ?  ?  ?  ?  ?Mobility Comments: per chart, pt was mod I with RW on memory care unit ?ADLs Comments: assist for baths and IADLs ?  ? ? ?Hand Dominance  ?   ? ?  ?Extremity/Trunk Assessment  ? Upper  Extremity Assessment ?Upper Extremity Assessment: Generalized weakness;Overall [facility] for tasks assessed ?  ? ?Lower Extremity Assessment ?Lower Extremity Assessment: Overall WFL for tasks assessed;Generalized weakness ?  ? ?   ?Communication  ? Communication: No difficulties  ?Cognition Arousal/Alertness: Awake/alert ?Behavior During Therapy: [facility] for tasks assessed/performed ?Overall Cognitive Status: History of cognitive impairments - at baseline ?  ?  ?  ?  ?  ?  ?  ?  ?  ?  ?  ?  ?  ?  ?  ?  ?General Comments: pt follows simple commands during session with extra time ?  ?  ? ?  ?General Comments   ? ?  ?Exercises    ? ?Assessment/Plan  ?  ?PT Assessment Patient needs continued PT services  ?PT Problem List Decreased strength;Decreased mobility;Decreased safety awareness;Decreased activity tolerance;Decreased balance;Cardiopulmonary status limiting activity ? ?   ?  ?PT Treatment Interventions Therapeutic exercise;DME instruction;Gait training;Balance training;Neuromuscular re-education;Functional mobility training;Therapeutic activities;Patient/family education   ? ?PT Goals (Current goals can be found in the Care Plan section)  ?Acute Rehab PT Goals ?Patient Stated Goal: none stated ?PT Goal Formulation: With patient/family ?Time For Goal Achievement: 09/16/21 ?Potential to Achieve Goals: Fair ? ?  ?Frequency Min 2X/week ?  ? ? ?Co-evaluation   ?  ?  ?  ?  ? ? ?  ?AM-PAC PT "6 Clicks" Mobility  ?Outcome Measure Help needed turning from your back to your side while in a flat bed without using bedrails?: A Little ?Help needed moving from lying on your back to sitting on the side of a flat bed without using bedrails?: A Little ?Help needed moving to and from a bed to a chair (including a wheelchair)?: A Little ?Help needed standing up from a chair using your arms (e.g., wheelchair or bedside chair)?: A Little ?Help needed to walk in hospital room?: A Little ?Help needed climbing 3-5 steps with a railing? : A Lot ?6  Click Score: 17 ? ?  ?End of Session Equipment Utilized During Treatment: Gait belt ?Activity Tolerance: Patient tolerated treatment well ?Patient left: in bed;with call bell/phone within reach;with bed alarm set;with family/visitor present ?Nurse Communication: Mobility status ?PT Visit Diagnosis: Unsteadiness on feet (R26.81);Muscle weakness (generalized) (M62.81) ?  ? ?Time: 4481-8563 ?PT Time Calculation (min) (ACUTE ONLY): 17 min ? ? ?Charges:   PT Evaluation ?$PT Eval Moderate Complexity: 1 Mod ?  ?  ?   ? ? ?Lavone Nian, PT, DPT ?09/02/21, 10:23 AM ? ? ?Waunita Schooner ?09/02/2021, 10:21 AM ? ?

## 2021-09-02 NOTE — Discharge Summary (Signed)
Physician Discharge Summary   Patient: Leslie Osborn MRN: [id number] DOB: 10-04-1925  Admit date:     08/31/2021  Discharge date: 09/02/21  Discharge Physician: Fritzi Mandes   PCP: Leslie Billet, MD   Recommendations at discharge:    F/u Dr Hall Busing as needed  Discharge Diagnoses: acute metabolic encephalopathy suspected due to progression of Alzheimer's dementia nontraumatic fall  Hospital Course:  Leslie Osborn is a 88 y.o. female with medical history significant of hypertension, hyperlipidemia, hypothyroidism, depression, RLS, colon cancer (s/p of chemotherapy), atrial fibrillation not on anticoagulants, Alzheimer's disease, breast cancer (carcinoma in situ), who presents with altered mental status and fall.   Acute metabolic encephalopathy suspected due to progression of Alzheimer's dementia. History of fall nontraumatic -- CT head negative -- MRI of the brain has motion to gradation. Dr. Cheral Marker from neurology reviewed MRI did not think patient has stroke. -- Appears this is likely dementia with behavioral disturbance -- no source of infection -- vitals stable -- continue Galantamine -- avoid benzodiazepines and Haldol   Hypothyroidism -- TSH 7.1 -- increased dose of Synthroid to 75 g   Hypertension asymptomatic bradycardia/history of a fib chronic -- switch to low-dose metoprolol -- continue amlodipine and lisinopril   long discussion with sister Leslie Osborn at bedside. Sister is concerned about financial constraints long-term and has had discussion with TOC to see if patient can get reasonably inexpensive place. TOC has discussed with care patrol. In the meantime patient will discharged to [facility] memory care today    Procedures: none Family communication : sister Leslie Osborn at bedside Consults : none CODE STATUS: DNR       Disposition: Assisted living Diet recommendation:  Discharge Diet Orders (From admission, onward)     Start     Ordered   09/02/21 0000  Diet -  low sodium heart healthy        09/02/21 0813           Cardiac diet DISCHARGE MEDICATION: Allergies as of 09/02/2021       Reactions   Donepezil Hcl Nausea And Vomiting, Diarrhea        Medication List     STOP taking these medications    atenolol 50 MG tablet Commonly known as: TENORMIN   Calcium Carb-Cholecalciferol 500-400 MG-UNIT Tabs       TAKE these medications    amLODipine 2.5 MG tablet Commonly known as: NORVASC Take 2.5 mg by mouth daily.   escitalopram 5 MG tablet Commonly known as: LEXAPRO Take 5 mg by mouth daily.   galantamine 12 MG tablet Commonly known as: RAZADYNE Take 12 mg by mouth 2 (two) times daily.   hydrochlorothiazide 12.5 MG tablet Commonly known as: HYDRODIURIL Take 12.5 mg by mouth daily.   levothyroxine 75 MCG tablet Commonly known as: SYNTHROID Take 1 tablet (75 mcg total) by mouth daily before breakfast. Start taking on: September 03, 2021 What changed:  medication strength how much to take   lisinopril 40 MG tablet Commonly known as: ZESTRIL Take 40 mg by mouth in the morning and at bedtime.   melatonin 3 MG Tabs tablet Take 3 mg by mouth at bedtime.   metoprolol tartrate 25 MG tablet Commonly known as: LOPRESSOR Take 0.5 tablets (12.5 mg total) by mouth 2 (two) times daily.   Ocutabs Tabs Take 2 tablets by mouth daily.   simvastatin 20 MG tablet Commonly known as: ZOCOR Take 20 mg by mouth every evening.   trolamine salicylate 10 % cream Commonly  known as: ASPERCREME Apply 1 application. topically 2 (two) times daily as needed for muscle pain.        Contact information for follow-up providers     Leslie Billet, MD. Go in 1 week(s).   Specialty: Internal Medicine Why: As needed Contact information: [address]   [place] [address] [phone]              Contact information for after-discharge care     Destination     [facility] .   Service:  Memory Care Contact information: [address] [place] [phone]                    Discharge Exam: Danley Danker Weights   08/31/21 1546  Weight: 82.6 kg     Condition at discharge: fair  The results of significant diagnostics from this hospitalization (including imaging, microbiology, ancillary and laboratory) are listed below for reference.   Imaging Studies: DG Forearm Left  Result Date: 08/31/2021 CLINICAL DATA:  Trauma, fall EXAM: LEFT FOREARM - 2 VIEW COMPARISON:  None. FINDINGS: No fracture or dislocation is seen. There is 2 mm metallic density in the soft tissues medial to the distal ulna, possibly residual from previous injury. There are scattered arterial calcifications in the soft tissues. IMPRESSION: No recent fracture or dislocation is seen in the left forearm. Electronically Signed   By: Elmer Picker M.D.   On: 08/31/2021 13:54   DG Wrist Complete Left  Result Date: 08/31/2021 CLINICAL DATA:  Trauma, fall EXAM: LEFT WRIST - COMPLETE 3+ VIEW COMPARISON:  None. FINDINGS: No recent fracture or dislocation is seen. Severe degenerative changes are noted in first carpometacarpal joint with bony spurs and adjacent loose bodies. Degenerative changes are noted in first metacarpophalangeal joint. There is 2 mm metallic density medial to the distal ulna possibly residual from previous injury. Arterial calcifications are seen in the soft tissues. IMPRESSION: No recent fracture or dislocation is seen in the left wrist. Severe degenerative changes are noted in first carpometacarpal joint. Electronically Signed   By: Elmer Picker M.D.   On: 08/31/2021 13:53   CT HEAD WO CONTRAST (5MM)  Result Date: 08/31/2021 CLINICAL DATA:  Head trauma, minor (Age >= 65y); Neck trauma (Age >= 65y) EXAM: CT HEAD WITHOUT CONTRAST CT CERVICAL SPINE WITHOUT CONTRAST TECHNIQUE: Multidetector CT imaging of the head and cervical spine was performed following the  standard protocol without intravenous contrast. Multiplanar CT image reconstructions of the cervical spine were also generated. RADIATION DOSE REDUCTION: This exam was performed according to the departmental dose-optimization program which includes automated exposure control, adjustment of the mA and/or kV according to patient size and/or use of iterative reconstruction technique. COMPARISON:  CT head 01/16/2021. FINDINGS: CT HEAD FINDINGS Brain: No evidence of acute infarction, hemorrhage, hydrocephalus, extra-axial collection or mass lesion/mass effect. Chronic mesial temporal atrophy with associated ex vacuo dilation of the temporal horns. Patchy white matter hypoattenuation, nonspecific but compatible with chronic microvascular ischemic disease. Vascular: Calcific intracranial atherosclerosis. Skull: No evidence of acute fracture. Sinuses/Orbits: Visualized sinuses are clear.  Unremarkable orbits. Other: No mastoid effusions. CT CERVICAL SPINE FINDINGS Mildly motion limited study. Alignment: Straightening of the normal cervical lordosis. Slight retrolisthesis of C5 on C6, favor degenerative given degenerative changes at this level. Otherwise, no substantial sagittal subluxation. Skull base and vertebrae: Vertebral body heights are maintained. No evidence of acute fracture. Soft tissues and spinal canal: No prevertebral fluid or swelling.  No visible canal hematoma. Disc levels: Degenerative changes greatest at C5-C6 where there is left eccentric disc height loss, endplate spurring and facet/uncovertebral hypertrophy. Resulting potentially severe left foraminal stenosis. Upper chest: Visualized lung apices are clear. Centrilobular emphysema. Aortic atherosclerosis. IMPRESSION: CT head: 1. No evidence of acute intracranial abnormality. 2. Chronic mesial temporal atrophy, which can be seen with Alzheimer's disease. CT cervical spine: 1. No evidence of acute fracture or traumatic malalignment on this mildly motion  limited study. 2. Left eccentric C5-C6 degenerative change with potentially severe left foraminal stenosis. An MRI could better evaluate if clinically indicated. 3. Aortic Atherosclerosis (ICD10-I70.0) and Emphysema (ICD10-J43.9). Electronically Signed   By: Margaretha Sheffield M.D.   On: 08/31/2021 11:57   CT Cervical Spine Wo Contrast  Result Date: 08/31/2021 CLINICAL DATA:  Head trauma, minor (Age >= 65y); Neck trauma (Age >= 65y) EXAM: CT HEAD WITHOUT CONTRAST CT CERVICAL SPINE WITHOUT CONTRAST TECHNIQUE: Multidetector CT imaging of the head and cervical spine was performed following the standard protocol without intravenous contrast. Multiplanar CT image reconstructions of the cervical spine were also generated. RADIATION DOSE REDUCTION: This exam was performed according to the departmental dose-optimization program which includes automated exposure control, adjustment of the mA and/or kV according to patient size and/or use of iterative reconstruction technique. COMPARISON:  CT head 01/16/2021. FINDINGS: CT HEAD FINDINGS Brain: No evidence of acute infarction, hemorrhage, hydrocephalus, extra-axial collection or mass lesion/mass effect. Chronic mesial temporal atrophy with associated ex vacuo dilation of the temporal horns. Patchy white matter hypoattenuation, nonspecific but compatible with chronic microvascular ischemic disease. Vascular: Calcific intracranial atherosclerosis. Skull: No evidence of acute fracture. Sinuses/Orbits: Visualized sinuses are clear.  Unremarkable orbits. Other: No mastoid effusions. CT CERVICAL SPINE FINDINGS Mildly motion limited study. Alignment: Straightening of the normal cervical lordosis. Slight retrolisthesis of C5 on C6, favor degenerative given degenerative changes at this level. Otherwise, no substantial sagittal subluxation. Skull base and vertebrae: Vertebral body heights are maintained. No evidence of acute fracture. Soft tissues and spinal canal: No prevertebral  fluid or swelling. No visible canal hematoma. Disc levels: Degenerative changes greatest at C5-C6 where there is left eccentric disc height loss, endplate spurring and facet/uncovertebral hypertrophy. Resulting potentially severe left foraminal stenosis. Upper chest: Visualized lung apices are clear. Centrilobular emphysema. Aortic atherosclerosis. IMPRESSION: CT head: 1. No evidence of acute intracranial abnormality. 2. Chronic mesial temporal atrophy, which can be seen with Alzheimer's disease. CT cervical spine: 1. No evidence of acute fracture or traumatic malalignment on this mildly motion limited study. 2. Left eccentric C5-C6 degenerative change with potentially severe left foraminal stenosis. An MRI could better evaluate if clinically indicated. 3. Aortic Atherosclerosis (ICD10-I70.0) and Emphysema (ICD10-J43.9). Electronically Signed   By: Margaretha Sheffield M.D.   On: 08/31/2021 11:57   MR BRAIN WO CONTRAST  Result Date: 08/31/2021 CLINICAL DATA:  Mental status change.  Confusion.  Fall. EXAM: MRI HEAD WITHOUT CONTRAST TECHNIQUE: Multiplanar, multiecho pulse sequences of the brain and surrounding structures were obtained without intravenous contrast. COMPARISON:  Head CT 08/31/2021 FINDINGS: The study is moderately motion degraded. Brain: There is a small amount of mild trace diffusion weighted signal hyperintensity in the left corona radiata with possibly mildly reduced ADC, indeterminate for acute to subacute infarct versus artifact/shine through. No acute cortically based infarct is present. A chronic microhemorrhage is noted in the left occipital lobe. Patchy to confluent T2 hyperintensities in the cerebral white matter and pons are nonspecific but compatible with moderate chronic small vessel ischemic disease. Chronic lacunar  infarcts are noted in the thalami. There is cerebral atrophy including advanced bilateral mesial temporal lobe volume loss. No mass, midline shift, or extra-axial fluid  collection is evident. Vascular: Major intracranial vascular flow voids are preserved. Skull and upper cervical spine: Unremarkable bone marrow signal. Sinuses/Orbits: Bilateral cataract extraction. Paranasal sinuses and mastoid air cells are clear. Other: None. IMPRESSION: 1. Motion degraded examination. 2. Questionable small acute to subacute left corona radiata infarct versus artifact. 3. Moderate chronic small vessel ischemic disease. 4. Advanced bilateral temporal lobe atrophy. Electronically Signed   By: Logan Bores M.D.   On: 08/31/2021 15:51    Microbiology: Results for orders placed or performed during the hospital encounter of 08/31/21  Resp Panel by RT-PCR (Flu A&B, Covid) Nasopharyngeal Swab     Status: None   Collection Time: 08/31/21  2:43 PM   Specimen: Nasopharyngeal Swab; Nasopharyngeal(NP) swabs in vial transport medium  Result Value Ref Range Status   SARS Coronavirus 2 by RT PCR NEGATIVE NEGATIVE Final    Comment: (NOTE) SARS-CoV-2 target nucleic acids are NOT DETECTED.  The SARS-CoV-2 RNA is generally detectable in upper respiratory specimens during the acute phase of infection. The lowest concentration of SARS-CoV-2 viral copies this assay can detect is 138 copies/mL. A negative result does not preclude SARS-Cov-2 infection and should not be used as the sole basis for treatment or other patient management decisions. A negative result may occur with  improper specimen collection/handling, submission of specimen other than nasopharyngeal swab, presence of viral mutation(s) within the areas targeted by this assay, and inadequate number of viral copies(<138 copies/mL). A negative result must be combined with clinical observations, patient history, and epidemiological information. The expected result is Negative.  Fact Sheet for Patients:  EntrepreneurPulse.com.au  Fact Sheet for Healthcare Providers:  IncredibleEmployment.be  This  test is no t yet approved or cleared by the [place] FDA and  has been authorized for detection and/or diagnosis of SARS-CoV-2 by FDA under an Emergency Use Authorization (EUA). This EUA will remain  in effect (meaning this test can be used) for the duration of the COVID-19 declaration under Section 564(b)(1) of the Act, 21 U.S.C.section 360bbb-3(b)(1), unless the authorization is terminated  or revoked sooner.       Influenza A by PCR NEGATIVE NEGATIVE Final   Influenza B by PCR NEGATIVE NEGATIVE Final    Comment: (NOTE) The Xpert Xpress SARS-CoV-2/FLU/RSV plus assay is intended as an aid in the diagnosis of influenza from Nasopharyngeal swab specimens and should not be used as a sole basis for treatment. Nasal washings and aspirates are unacceptable for Xpert Xpress SARS-CoV-2/FLU/RSV testing.  Fact Sheet for Patients: EntrepreneurPulse.com.au  Fact Sheet for Healthcare Providers: IncredibleEmployment.be  This test is not yet approved or cleared by the [place] FDA and has been authorized for detection and/or diagnosis of SARS-CoV-2 by FDA under an Emergency Use Authorization (EUA). This EUA will remain in effect (meaning this test can be used) for the duration of the COVID-19 declaration under Section 564(b)(1) of the Act, 21 U.S.C. section 360bbb-3(b)(1), unless the authorization is terminated or revoked.  Performed at [facility], [address] [place], [place] [address]     Labs: CBC: Recent Labs  Lab 08/31/21 1132  WBC 7.1  NEUTROABS 5.1  HGB 13.1  HCT 40.3  MCV 94.6  PLT 740   Basic Metabolic Panel: Recent Labs  Lab 08/31/21 1132  NA 134*  K 4.5  CL 97*  CO2 30  GLUCOSE 113*  BUN 18  CREATININE 0.87  CALCIUM 9.3  MG 1.9   Liver Function Tests: No results for input(s): AST, ALT, ALKPHOS, BILITOT, PROT, ALBUMIN in the last 168 hours. CBG: Recent Labs  Lab 09/01/21 0848 09/02/21 0757   GLUCAP 81 78    Discharge time spent: less than 30 minutes.  Signed: Fritzi Mandes, MD [facility] 09/02/2021

## 2021-09-02 NOTE — TOC Transition Note (Signed)
Transition of Care (TOC) - CM/SW Discharge Note ? ? ?Patient Details  ?Name: Leslie Osborn ?MRN: [id number] ?Date of Birth: August 24, 1925 ? ?Transition of Care (TOC) CM/SW Contact:  ?Boris Sharper, LCSW ?Phone Number: ?09/02/2021, 12:15 PM ? ? ?Clinical Narrative:    ?Pt medically stable for discharge per MD. Pt will be retuning to [facility] via EMS. Call to report number is [phone]. CSW will arrange EMS transport once nursing staff is ready. ? ? ?Final next level of care: [facility] ?Barriers to Discharge: No Barriers Identified ? ? ?Patient Goals and CMS Choice ?  ?  ?  ? ?Discharge Placement ?  ?           ?  ?Patient to be transferred to facility by: [facility] ?Name of family member notified: Wilma ?Patient and family notified of of transfer: 09/02/21 ? ?Discharge Plan and Services ?  ?Discharge Planning Services: CM Consult ?           ?  ?  ?  ?  ?  ?  ?  ?  ?  ?  ? ?Social Determinants of Health (SDOH) Interventions ?  ? ? ?Readmission Risk Interventions ?No flowsheet data found. ? ? ? ? ?

## 2023-07-21 ENCOUNTER — Emergency Department: Payer: Medicare Other

## 2023-07-21 ENCOUNTER — Encounter: Payer: Self-pay | Admitting: Emergency Medicine

## 2023-07-21 ENCOUNTER — Other Ambulatory Visit: Payer: Self-pay

## 2023-07-21 ENCOUNTER — Emergency Department
Admission: EM | Admit: 2023-07-21 | Discharge: 2023-07-21 | Disposition: A | Discharge status: Home / Self Care | Payer: Medicare Other | Attending: Emergency Medicine | Admitting: Emergency Medicine

## 2023-07-21 DIAGNOSIS — W1839XA Other fall on same level, initial encounter: Secondary | ICD-10-CM | POA: Insufficient documentation

## 2023-07-21 DIAGNOSIS — F0390 Unspecified dementia without behavioral disturbance: Secondary | ICD-10-CM | POA: Diagnosis not present

## 2023-07-21 DIAGNOSIS — Y92121 Bathroom in nursing home as the place of occurrence of the external cause: Secondary | ICD-10-CM | POA: Insufficient documentation

## 2023-07-21 DIAGNOSIS — I6789 Other cerebrovascular disease: Secondary | ICD-10-CM | POA: Insufficient documentation

## 2023-07-21 DIAGNOSIS — Z23 Encounter for immunization: Secondary | ICD-10-CM | POA: Insufficient documentation

## 2023-07-21 DIAGNOSIS — S0121XA Laceration without foreign body of nose, initial encounter: Secondary | ICD-10-CM | POA: Insufficient documentation

## 2023-07-21 DIAGNOSIS — S01511A Laceration without foreign body of lip, initial encounter: Secondary | ICD-10-CM | POA: Diagnosis not present

## 2023-07-21 DIAGNOSIS — W19XXXA Unspecified fall, initial encounter: Secondary | ICD-10-CM

## 2023-07-21 MED ORDER — TETANUS-DIPHTH-ACELL PERTUSSIS 5-2.5-18.5 LF-MCG/0.5 IM SUSY
0.5000 mL | PREFILLED_SYRINGE | Freq: Once | INTRAMUSCULAR | Status: AC
  Administered 2023-07-21: 0.5 mL via INTRAMUSCULAR
  Filled 2023-07-21: qty 0.5

## 2023-07-21 MED ORDER — LIDOCAINE-EPINEPHRINE-TETRACAINE (LET) TOPICAL GEL
3.0000 mL | Freq: Once | TOPICAL | Status: AC
  Administered 2023-07-21: 3 mL via TOPICAL

## 2023-07-21 MED ORDER — LIDOCAINE HCL (PF) 1 % IJ SOLN
5.0000 mL | Freq: Once | INTRAMUSCULAR | Status: AC
  Administered 2023-07-21: 5 mL via INTRADERMAL

## 2023-07-21 MED ORDER — LIDOCAINE HCL (PF) 1 % IJ SOLN
5.0000 mL | Freq: Once | INTRAMUSCULAR | Status: AC
  Administered 2023-07-21: 5 mL via INTRADERMAL
  Filled 2023-07-21: qty 5

## 2023-07-21 NOTE — ED Provider Notes (Addendum)
[facility] Provider Note    Event Date/Time   First MD Initiated Contact with Patient 07/21/23 [phone]     (approximate)   History   Fall   HPI Leslie Osborn is a 89 y.o. female with dementia presenting today for fall.  Patient reportedly had a fall in the bathroom at her facility.  They noted a laceration and bruising to the front of her face.  Unsure of loss of consciousness.  Patient is unable to contribute significantly to her history given her dementia.  She is denying any pain symptoms here at this time.  Notable abrasions across the nose, upper lip, and chin.  Not on any blood thinners.     Physical Exam   Triage Vital Signs: ED Triage Vitals [07/21/23 0623]  Encounter Vitals Group     BP (!) 151/72     Systolic BP Percentile      Diastolic BP Percentile      Pulse Rate (!) 52     Resp 18     Temp 97.6 F (36.4 C)     Temp Source Oral     SpO2 100 %     Weight      Height      Head Circumference      Peak Flow      Pain Score 0     Pain Loc      Pain Education      Exclude from Growth Chart     Most recent vital signs: Vitals:   07/21/23 0623  BP: (!) 151/72  Pulse: (!) 52  Resp: 18  Temp: 97.6 F (36.4 C)  SpO2: 100%   I have reviewed the vital signs. General:  Awake, alert, no acute distress. Head:  Normocephalic, hematoma to chin, abrasion to upper lip, laceration to bridge of nose, no other scalp hematomas. EENT:  PERRL, EOMI, Oral mucosa pink and moist, Neck is supple. No septal hematoma Cardiovascular: Regular rate, 2+ distal pulses. Respiratory:  Normal respiratory effort, symmetrical expansion, no distress.   Extremities:  Moving all four extremities through full ROM without pain.   Neuro:  Alert and oriented.  Interacting appropriately.  Oriented to name but not place which appears consistent with her baseline. Skin:  Warm, dry, no rash.   Psych: Appropriate affect.    ED Results / Procedures / Treatments    Labs (all labs ordered are listed, but only abnormal results are displayed) Labs Reviewed - No data to display   EKG    RADIOLOGY Independently interpreted CT imaging with no acute traumatic pathology to the head, C-spine, or face   PROCEDURES:  Critical Care performed: No  .Laceration Repair  Date/Time: 07/21/2023 8:49 AM  Performed by: Janith Lima, MD Authorized by: Janith Lima, MD   Consent:    Consent obtained:  Verbal   Consent given by:  Healthcare agent   Risks, benefits, and alternatives were discussed: yes     Risks discussed:  Infection, pain, poor cosmetic result and poor wound healing   Alternatives discussed:  No treatment Universal protocol:    Patient identity confirmed:  Arm band Anesthesia:    Anesthesia method:  Topical application and local infiltration   Topical anesthetic:  LET   Local anesthetic:  Lidocaine 1% w/o epi Laceration details:    Location: Nasal bridge, upper lip.   Length (cm):  0 (2 cm nose, 1 cm lip)   Depth (mm):  1 Pre-procedure details:  Preparation:  Patient was prepped and draped in usual sterile fashion and imaging obtained to evaluate for foreign bodies Exploration:    Imaging outcome: foreign body noted   Treatment:    Area cleansed with:  Chlorhexidine   Amount of cleaning:  Standard   Irrigation solution:  Sterile saline   Irrigation method:  Tap   Debridement:  Minimal   Undermining:  None Skin repair:    Repair method:  Sutures and tissue adhesive   Suture size:  5-0   Wound skin closure material used: Vicryl.   Suture technique:  Simple interrupted   Number of sutures:  3 Approximation:    Approximation:  Close Repair type:    Repair type:  Simple Post-procedure details:    Dressing:  Open (no dressing)   Procedure completion:  Tolerated well, no immediate complications    MEDICATIONS ORDERED IN ED: Medications  Tdap (BOOSTRIX) injection 0.5 mL (has no administration in time range)   lidocaine-EPINEPHrine-tetracaine (LET) topical gel (3 mLs Topical Given by Other 07/21/23 0744)  lidocaine (PF) (XYLOCAINE) 1 % injection 5 mL (5 mLs Intradermal Given by Other 07/21/23 0744)  lidocaine-EPINEPHrine-tetracaine (LET) topical gel (3 mLs Topical Given by Other 07/21/23 0801)  lidocaine (PF) (XYLOCAINE) 1 % injection 5 mL (5 mLs Intradermal Given by Other 07/21/23 2956)     IMPRESSION / MDM / ASSESSMENT AND PLAN / ED COURSE  I reviewed the triage vital signs and the nursing notes.                              Differential diagnosis includes, but is not limited to, ICH, cervical spine injury, facial fracture, soft tissue hematoma, facial laceration  Patient's presentation is most consistent with acute complicated illness / injury requiring diagnostic workup.  Patient is a 89 year old female presenting today for ground-level fall with face injury.  She is in no acute distress on arrival but has noted trauma to the front of her face.  CT imaging of head, C-spine, and maxillofacial showed no acute traumatic pathology outside of abrasions and lacerations.  3 absorbable sutures placed in the lip for repair.  Dermabond placed over nasal laceration.  Patient otherwise safe for discharge and follow-up with her PCP as needed.  Tetanus shot updated here today.  Clinical Course as of 07/21/23 0848  Wynelle Link Jul 21, 2023  0827 Spoke with patient's sister at bedside.  States normal mental status baseline.  Will repair lacerations and have her follow-up with PCP as needed [DW]    Clinical Course User Index [DW] Janith Lima, MD     FINAL CLINICAL IMPRESSION(S) / ED DIAGNOSES   Final diagnoses:  Fall, initial encounter  Laceration of nose, initial encounter  Lip laceration, initial encounter     Rx / DC Orders   ED Discharge Orders     None        Note:  This document was prepared using Dragon voice recognition software and may include unintentional dictation errors.   Janith Lima, MD 07/21/23 2130    Janith Lima, MD 07/21/23 [phone]

## 2023-07-21 NOTE — ED Notes (Signed)
Pt contact made and myself introduced. Pt is CAO per her normal and breathing normally. Pt appears relaxed and not in any distress at this time. Abrasions noted to pt's nose, lips, and chin.

## 2023-07-21 NOTE — Discharge Instructions (Addendum)
Please follow-up with your primary care provider within a week for reassessment as needed.  Your sutures are absorbable and should dissolve within a week.

## 2023-07-21 NOTE — ED Notes (Signed)
Up to the bathroom with assistance

## 2023-07-21 NOTE — ED Triage Notes (Signed)
Pt to ED via EMS from the [facility] after a fall in the bathroom Laceration to nose, lip. No blood thinners on med list. Unwitnessed fall. Unsure of LOC.

## 2023-07-21 NOTE — ED Provider Triage Note (Signed)
Emergency Medicine Provider Triage Evaluation Note  MAUDE HETTICH , a 89 y.o. female  was evaluated in triage.  Pt complains of fall and facial trauma.  Patient comes from locked memory care unit, apparently she went to the bathroom and fell forward onto her face.  Details are unclear.  Patient is awake and alert but oriented only to self and does not remember what happened.  Review of Systems  Positive: Visible facial trauma mostly to the nose and lip.   Negative: Patient denying pain to her head and neck.  Denies chest pain and abdominal pain.  Physical Exam  BP (!) 151/72 (BP Location: Right Arm)   Pulse (!) 52   Temp 97.6 F (36.4 C) (Oral)   Resp 18   SpO2 100%  Gen:   Awake, alert but oriented only to self.  Uncertain as to what happened. Resp:  Normal effort  MSK:   Moves extremities without difficulty  Other:  Obvious facial trauma  Medical Decision Making  Medically screening exam initiated at 6:26 AM.  Appropriate orders placed.  JESSYCA SLOAN was informed that the remainder of the evaluation will be completed by another provider, this initial triage assessment does not replace that evaluation, and the importance of remaining in the ED until their evaluation is complete.  Patient comes with DNR paperwork.  Ordered CT head, C-spine, and maxillofacial.   Loleta Rose, MD 07/21/23 0630

## 2023-07-21 NOTE — ED Notes (Signed)
See triage note  Received pt from night shift  Pt presents s/p fall  face first  Bruising noted to chin,right eye  abrasion to nose  laceration to lip

## 2023-12-15 ENCOUNTER — Other Ambulatory Visit: Payer: Self-pay

## 2023-12-15 ENCOUNTER — Emergency Department

## 2023-12-15 ENCOUNTER — Emergency Department
Admission: EM | Admit: 2023-12-15 | Discharge: 2023-12-15 | Disposition: A | Discharge status: Home / Self Care | Attending: Emergency Medicine | Admitting: Emergency Medicine

## 2023-12-15 DIAGNOSIS — I10 Essential (primary) hypertension: Secondary | ICD-10-CM | POA: Insufficient documentation

## 2023-12-15 DIAGNOSIS — R079 Chest pain, unspecified: Secondary | ICD-10-CM | POA: Diagnosis present

## 2023-12-15 DIAGNOSIS — G309 Alzheimer's disease, unspecified: Secondary | ICD-10-CM | POA: Insufficient documentation

## 2023-12-15 LAB — COMPREHENSIVE METABOLIC PANEL WITH GFR
ALT: 14 U/L (ref 0–44)
AST: 36 U/L (ref 15–41)
Albumin: 3.8 g/dL (ref 3.5–5.0)
Alkaline Phosphatase: 65 U/L (ref 38–126)
Anion gap: 10 (ref 5–15)
BUN: 29 mg/dL — ABNORMAL HIGH (ref 8–23)
CO2: 28 mmol/L (ref 22–32)
Calcium: 9.3 mg/dL (ref 8.9–10.3)
Chloride: 102 mmol/L (ref 98–111)
Creatinine, Ser: 0.95 mg/dL (ref 0.44–1.00)
GFR, Estimated: 55 mL/min — ABNORMAL LOW (ref >60)
Glucose, Bld: 100 mg/dL — ABNORMAL HIGH (ref 70–99)
Potassium: 4.9 mmol/L (ref 3.5–5.1)
Sodium: 140 mmol/L (ref 135–145)
Total Bilirubin: 1.4 mg/dL — ABNORMAL HIGH (ref 0.0–1.2)
Total Protein: 7.5 g/dL (ref 6.5–8.1)

## 2023-12-15 LAB — CBC
HCT: 44.1 % (ref 36.0–46.0)
Hemoglobin: 14.4 g/dL (ref 12.0–15.0)
MCH: 31.9 pg (ref 26.0–34.0)
MCHC: 32.7 g/dL (ref 30.0–36.0)
MCV: 97.8 fL (ref 80.0–100.0)
Platelets: 171 10*3/uL (ref 150–400)
RBC: 4.51 MIL/uL (ref 3.87–5.11)
RDW: 14.8 % (ref 11.5–15.5)
WBC: 7.2 10*3/uL (ref 4.0–10.5)
nRBC: 0 % (ref 0.0–0.2)

## 2023-12-15 LAB — TROPONIN I (HIGH SENSITIVITY)
Troponin I (High Sensitivity): 13 ng/L (ref <18)
Troponin I (High Sensitivity): 17 ng/L (ref <18)

## 2023-12-15 NOTE — ED Provider Notes (Signed)
 [facility] Provider Note    Event Date/Time   First MD Initiated Contact with Patient 12/15/23 1535     (approximate)  History   Chief Complaint: Chest Pain  HPI  Leslie Osborn is a 89 y.o. female with a past medical history of Alzheimer's, hypertension, presents to the emergency department for possible chest pain.  According to report patient is coming from [place], sister was visiting this morning when she states that the patient clutched her chest and seemed to be uncomfortable.  In the emergency department patient does not know why she is here, denies any symptoms myself.  Specifically denies any chest pain.  Patient is asking if she can go home, but is agreeable to stay for the workup.  Physical Exam   Triage Vital Signs: ED Triage Vitals  Encounter Vitals Group     BP 12/15/23 1518 (!) 183/71     Girls Systolic BP Percentile --      Girls Diastolic BP Percentile --      Boys Systolic BP Percentile --      Boys Diastolic BP Percentile --      Pulse Rate 12/15/23 1514 (!) 49     Resp 12/15/23 1514 16     Temp 12/15/23 1514 98.4 F (36.9 C)     Temp Source 12/15/23 1514 Oral     SpO2 12/15/23 1514 99 %     Weight --      Height --      Head Circumference --      Peak Flow --      Pain Score --      Pain Loc --      Pain Education --      Exclude from Growth Chart --     Most recent vital signs: Vitals:   12/15/23 1514 12/15/23 1518  BP:  (!) 183/71  Pulse: (!) 49   Resp: 16   Temp: 98.4 F (36.9 C)   SpO2: 99%     General: Awake, no distress.  CV:  Good peripheral perfusion.  Regular rate and rhythm  Resp:  Normal effort.  Equal breath sounds bilaterally.  Abd:  No distention.  Soft, nontender.  No rebound or guarding.  ED Results / Procedures / Treatments   EKG  EKG viewed and interpreted by myself shows atrial fibrillation at 53 bpm with a widened QRS, left axis deviation, left bundle branch block nonspecific ST  changes.  Patient has a history of chronic atrial fibrillation documented.  RADIOLOGY  I have reviewed interpret the chest x-ray images.  Patient has cardiomegaly but no other concerning findings. Radiology has read the x-ray as mild cardiomegaly with no acute finding.   MEDICATIONS ORDERED IN ED: Medications - No data to display   IMPRESSION / MDM / ASSESSMENT AND PLAN / ED COURSE  I reviewed the triage vital signs and the nursing notes.  Patient's presentation is most consistent with acute presentation with potential threat to life or bodily function.  Patient presents to the emergency department for possible chest pain earlier today.  Per report patient clutched her chest earlier here the patient denies any chest pain.  She states she is ready to go home but after speaking to her she is agreeable to stay for workup.  Patient has a reassuring physical exam.  Chest x-ray negative.  Triage nurse states the patient was complaining of left shoulder pain however patient denies this to myself.  Left  shoulder x-ray negative.  Will check labs including a CBC chemistry and troponin x 2.  Will continue to closely monitor in the emergency department.  Patient's lab work today shows a reassuring CBC with a normal white blood cell count, reassuring chemistry.  Patient's troponin is negative x 2 with minimal change.  Patient's shoulder and chest x-ray are negative.  Patient continues to be symptom-free in the emergency department.  Sister is here with the patient.  Given the patient's reassuring workup I believe the patient safe for discharge home with outpatient follow-up.  Sister agreeable to plan.  FINAL CLINICAL IMPRESSION(S) / ED DIAGNOSES   Chest pain    Note:  This document was prepared using Dragon voice recognition software and may include unintentional dictation errors.   Dorothyann Drivers, MD 12/15/23 1840

## 2023-12-15 NOTE — ED Triage Notes (Signed)
 BIBEMS, coming from home [address] [place]. Sister was visiting and stated Pt was clutching chest. Pt reported L shoulder/arm.GCS 14 baseline, GCS 14 now. DNR @bedside . 183/73, VSS otherwise. BG: 124

## 2024-02-25 ENCOUNTER — Emergency Department

## 2024-02-25 ENCOUNTER — Inpatient Hospital Stay
Admission: EM | Admit: 2024-02-25 | Discharge: 2024-03-02 | DRG: 064 | Disposition: A | Discharge status: Skilled Nursing Facility | Source: Skilled Nursing Facility | Attending: Internal Medicine | Admitting: Internal Medicine

## 2024-02-25 ENCOUNTER — Other Ambulatory Visit: Payer: Self-pay

## 2024-02-25 DIAGNOSIS — G309 Alzheimer's disease, unspecified: Secondary | ICD-10-CM | POA: Diagnosis present

## 2024-02-25 DIAGNOSIS — Z9181 History of falling: Secondary | ICD-10-CM | POA: Diagnosis not present

## 2024-02-25 DIAGNOSIS — G2581 Restless legs syndrome: Secondary | ICD-10-CM | POA: Diagnosis present

## 2024-02-25 DIAGNOSIS — Z87891 Personal history of nicotine dependence: Secondary | ICD-10-CM

## 2024-02-25 DIAGNOSIS — R29708 NIHSS score 8: Secondary | ICD-10-CM | POA: Diagnosis not present

## 2024-02-25 DIAGNOSIS — Z66 Do not resuscitate: Secondary | ICD-10-CM | POA: Diagnosis present

## 2024-02-25 DIAGNOSIS — E785 Hyperlipidemia, unspecified: Secondary | ICD-10-CM | POA: Diagnosis present

## 2024-02-25 DIAGNOSIS — I619 Nontraumatic intracerebral hemorrhage, unspecified: Secondary | ICD-10-CM | POA: Diagnosis present

## 2024-02-25 DIAGNOSIS — G8191 Hemiplegia, unspecified affecting right dominant side: Secondary | ICD-10-CM | POA: Diagnosis present

## 2024-02-25 DIAGNOSIS — I4811 Longstanding persistent atrial fibrillation: Secondary | ICD-10-CM | POA: Diagnosis present

## 2024-02-25 DIAGNOSIS — D696 Thrombocytopenia, unspecified: Secondary | ICD-10-CM | POA: Diagnosis present

## 2024-02-25 DIAGNOSIS — Z8249 Family history of ischemic heart disease and other diseases of the circulatory system: Secondary | ICD-10-CM

## 2024-02-25 DIAGNOSIS — Z8551 Personal history of malignant neoplasm of bladder: Secondary | ICD-10-CM | POA: Diagnosis not present

## 2024-02-25 DIAGNOSIS — Z90710 Acquired absence of both cervix and uterus: Secondary | ICD-10-CM | POA: Diagnosis not present

## 2024-02-25 DIAGNOSIS — I4891 Unspecified atrial fibrillation: Secondary | ICD-10-CM | POA: Diagnosis not present

## 2024-02-25 DIAGNOSIS — Z79890 Hormone replacement therapy (postmenopausal): Secondary | ICD-10-CM | POA: Diagnosis not present

## 2024-02-25 DIAGNOSIS — S0636AA Traumatic hemorrhage of cerebrum, unspecified, with loss of consciousness status unknown, initial encounter: Secondary | ICD-10-CM | POA: Diagnosis not present

## 2024-02-25 DIAGNOSIS — G9341 Metabolic encephalopathy: Secondary | ICD-10-CM | POA: Diagnosis present

## 2024-02-25 DIAGNOSIS — Z888 Allergy status to other drugs, medicaments and biological substances status: Secondary | ICD-10-CM

## 2024-02-25 DIAGNOSIS — E039 Hypothyroidism, unspecified: Secondary | ICD-10-CM | POA: Diagnosis present

## 2024-02-25 DIAGNOSIS — Z853 Personal history of malignant neoplasm of breast: Secondary | ICD-10-CM | POA: Diagnosis not present

## 2024-02-25 DIAGNOSIS — F0280 Dementia in other diseases classified elsewhere without behavioral disturbance: Secondary | ICD-10-CM | POA: Diagnosis present

## 2024-02-25 DIAGNOSIS — Z9221 Personal history of antineoplastic chemotherapy: Secondary | ICD-10-CM | POA: Diagnosis not present

## 2024-02-25 DIAGNOSIS — Z79899 Other long term (current) drug therapy: Secondary | ICD-10-CM

## 2024-02-25 DIAGNOSIS — I10 Essential (primary) hypertension: Secondary | ICD-10-CM | POA: Diagnosis present

## 2024-02-25 DIAGNOSIS — Z86000 Personal history of in-situ neoplasm of breast: Secondary | ICD-10-CM

## 2024-02-25 DIAGNOSIS — Z85038 Personal history of other malignant neoplasm of large intestine: Secondary | ICD-10-CM | POA: Diagnosis not present

## 2024-02-25 DIAGNOSIS — Z8541 Personal history of malignant neoplasm of cervix uteri: Secondary | ICD-10-CM | POA: Diagnosis not present

## 2024-02-25 DIAGNOSIS — Z171 Estrogen receptor negative status [ER-]: Secondary | ICD-10-CM

## 2024-02-25 DIAGNOSIS — I340 Nonrheumatic mitral (valve) insufficiency: Secondary | ICD-10-CM | POA: Diagnosis not present

## 2024-02-25 DIAGNOSIS — S06369A Traumatic hemorrhage of cerebrum, unspecified, with loss of consciousness of unspecified duration, initial encounter: Secondary | ICD-10-CM | POA: Diagnosis not present

## 2024-02-25 LAB — TECHNOLOGIST SMEAR REVIEW: Plt Morphology: NORMAL

## 2024-02-25 LAB — CBC WITH DIFFERENTIAL/PLATELET
Abs Immature Granulocytes: 0.02 K/uL (ref 0.00–0.07)
Basophils Absolute: 0 K/uL (ref 0.0–0.1)
Basophils Relative: 1 %
Eosinophils Absolute: 0 K/uL (ref 0.0–0.5)
Eosinophils Relative: 0 %
HCT: 42.4 % (ref 36.0–46.0)
Hemoglobin: 13.6 g/dL (ref 12.0–15.0)
Immature Granulocytes: 0 %
Lymphocytes Relative: 12 %
Lymphs Abs: 0.7 K/uL (ref 0.7–4.0)
MCH: 31.8 pg (ref 26.0–34.0)
MCHC: 32.1 g/dL (ref 30.0–36.0)
MCV: 99.1 fL (ref 80.0–100.0)
Monocytes Absolute: 0.4 K/uL (ref 0.1–1.0)
Monocytes Relative: 7 %
Neutro Abs: 4.5 K/uL (ref 1.7–7.7)
Neutrophils Relative %: 80 %
Platelets: 120 K/uL — ABNORMAL LOW (ref 150–400)
RBC: 4.28 MIL/uL (ref 3.87–5.11)
RDW: 14.1 % (ref 11.5–15.5)
WBC: 5.7 K/uL (ref 4.0–10.5)
nRBC: 0 % (ref 0.0–0.2)

## 2024-02-25 LAB — CK: Total CK: 57 U/L (ref 38–234)

## 2024-02-25 LAB — COMPREHENSIVE METABOLIC PANEL WITH GFR
ALT: 11 U/L (ref 0–44)
AST: 29 U/L (ref 15–41)
Albumin: 3.7 g/dL (ref 3.5–5.0)
Alkaline Phosphatase: 53 U/L (ref 38–126)
Anion gap: 13 (ref 5–15)
BUN: 28 mg/dL — ABNORMAL HIGH (ref 8–23)
CO2: 26 mmol/L (ref 22–32)
Calcium: 9.2 mg/dL (ref 8.9–10.3)
Chloride: 99 mmol/L (ref 98–111)
Creatinine, Ser: 0.95 mg/dL (ref 0.44–1.00)
GFR, Estimated: 55 mL/min — ABNORMAL LOW (ref >60)
Glucose, Bld: 100 mg/dL — ABNORMAL HIGH (ref 70–99)
Potassium: 3.8 mmol/L (ref 3.5–5.1)
Sodium: 138 mmol/L (ref 135–145)
Total Bilirubin: 0.8 mg/dL (ref 0.0–1.2)
Total Protein: 6.7 g/dL (ref 6.5–8.1)

## 2024-02-25 LAB — TROPONIN I (HIGH SENSITIVITY)
Troponin I (High Sensitivity): 13 ng/L (ref <18)
Troponin I (High Sensitivity): 17 ng/L (ref <18)

## 2024-02-25 LAB — PROTIME-INR
INR: 1.1 (ref 0.8–1.2)
Prothrombin Time: 14.9 s (ref 11.4–15.2)

## 2024-02-25 LAB — APTT: aPTT: 31 s (ref 24–36)

## 2024-02-25 LAB — GLUCOSE, CAPILLARY: Glucose-Capillary: 96 mg/dL (ref 70–99)

## 2024-02-25 LAB — MRSA NEXT GEN BY PCR, NASAL: MRSA by PCR Next Gen: NOT DETECTED

## 2024-02-25 MED ORDER — CLEVIDIPINE BUTYRATE 0.5 MG/ML IV EMUL
0.0000 mg/h | INTRAVENOUS | Status: DC
  Administered 2024-02-25: 2 mg/h via INTRAVENOUS
  Administered 2024-02-25: 6 mg/h via INTRAVENOUS
  Administered 2024-02-26 (×3): 4 mg/h via INTRAVENOUS
  Filled 2024-02-25 (×7): qty 50

## 2024-02-25 MED ORDER — STROKE: EARLY STAGES OF RECOVERY BOOK: Freq: Once | Status: AC

## 2024-02-25 MED ORDER — ACETAMINOPHEN 325 MG PO TABS
650.0000 mg | ORAL_TABLET | ORAL | Status: DC | PRN
  Administered 2024-02-29 – 2024-03-02 (×7): 650 mg via ORAL
  Filled 2024-02-25 (×7): qty 2

## 2024-02-25 MED ORDER — MORPHINE SULFATE (PF) 2 MG/ML IV SOLN
2.0000 mg | Freq: Once | INTRAVENOUS | Status: AC
  Administered 2024-02-25: 2 mg via INTRAVENOUS
  Filled 2024-02-25: qty 1

## 2024-02-25 MED ORDER — PANTOPRAZOLE SODIUM 40 MG IV SOLR
40.0000 mg | Freq: Every day | INTRAVENOUS | Status: DC
  Administered 2024-02-25 – 2024-02-27 (×3): 40 mg via INTRAVENOUS
  Filled 2024-02-25 (×3): qty 10

## 2024-02-25 MED ORDER — ORAL CARE MOUTH RINSE: 15.0000 mL | OROMUCOSAL | Status: DC | PRN

## 2024-02-25 MED ORDER — SENNOSIDES-DOCUSATE SODIUM 8.6-50 MG PO TABS
1.0000 | ORAL_TABLET | Freq: Two times a day (BID) | ORAL | Status: DC
  Administered 2024-02-26 – 2024-03-02 (×10): 1 via ORAL
  Filled 2024-02-25 (×11): qty 1

## 2024-02-25 MED ORDER — IOHEXOL 350 MG/ML SOLN
75.0000 mL | Freq: Once | INTRAVENOUS | Status: AC | PRN
  Administered 2024-02-25: 75 mL via INTRAVENOUS

## 2024-02-25 MED ORDER — ACETAMINOPHEN 160 MG/5ML PO SOLN: 650.0000 mg | ORAL | Status: DC | PRN

## 2024-02-25 MED ORDER — ACETAMINOPHEN 650 MG RE SUPP
650.0000 mg | RECTAL | Status: DC | PRN
Start: 2024-02-25 — End: 2024-03-02

## 2024-02-25 MED ORDER — CHLORHEXIDINE GLUCONATE CLOTH 2 % EX PADS
6.0000 | MEDICATED_PAD | Freq: Every day | CUTANEOUS | Status: DC
  Administered 2024-02-25 – 2024-02-27 (×3): 6 via TOPICAL

## 2024-02-25 NOTE — ED Notes (Signed)
Fall bundle in place

## 2024-02-25 NOTE — Consult Note (Signed)
 NEUROLOGY CONSULT NOTE   Date of service: February 25, 2024 Patient Name: Leslie Osborn MRN:  [id number] DOB:  22-Aug-1925 Chief Complaint: Right-sided weakness Requesting Provider: Ernest Ronal BRAVO, MD  History of Present Illness  Leslie Osborn is a 89 y.o. female with hx of atrial fibrillation who is not on anticoagulation who presents with right-sided weakness that was noted by her family member today.  The patient has a history of dementia and is currently living in a memory care unit.  She was seen by her family member yesterday and did not have any weakness at the time  LKW: 2 PM 9/1 Modified rankin score: 3-Moderate disability-requires help but walks WITHOUT assistance IV Thrombolysis: No, ICH ICH Score: 1  NIHSS components Score: Comment  1a Level of Conscious 0[x]  1[]  2[]  3[]      1b LOC Questions 0[]  1[]  2[x]       1c LOC Commands 0[]  1[]  2[]       2 Best Gaze 0[x]  1[]  2[]       3 Visual 0[x]  1[]  2[]  3[]      4 Facial Palsy 0[x]  1[]  2[]  3[]      5a Motor Arm - left 0[x]  1[]  2[]  3[]  4[]  UN[]    5b Motor Arm - Right 0[]  1[]  2[x]  3[]  4[]  UN[]    6a Motor Leg - Left 0[x]  1[]  2[]  3[]  4[]  UN[]    6b Motor Leg - Right 0[]  1[]  2[]  3[x]  4[]  UN[]    7 Limb Ataxia 0[x]  1[]  2[]  UN[]      8 Sensory 0[]  1[x]  2[]  UN[]      9 Best Language 0[x]  1[]  2[]  3[]      10 Dysarthria 0[x]  1[]  2[]  UN[]      11 Extinct. and Inattention 0[x]  1[]  2[]       TOTAL: 8     Past History   Past Medical History:  Diagnosis Date   Alzheimer disease (HCC)    Bladder cancer (HCC) 2009   Cancer of breast (HCC) 2011   R-Breast DCIS, ER/PR negative   Carcinoma in situ of breast 2011   R-breast   Cataract extraction status of left eye    Cataract extraction status of right eye    Cervical cancer (HCC) ?   Colon cancer (HCC) 1992   chemo   Family history of adverse reaction to anesthesia    sister got nauseated after surgery many years ago.   Hypertension 1991   Hypothyroidism    Macular degeneration 2012    Mammographic microcalcification    Restless legs syndrome     Past Surgical History:  Procedure Laterality Date   ABDOMINAL HYSTERECTOMY  1972   APPENDECTOMY     BLADDER SURGERY  2009   BREAST BIOPSY  1960   BREAST BIOPSY Right 2011   positive- radiation   BREAST CYST EXCISION Right    BREAST SURGERY Right 11/15/09   lumpectomy   COLON SURGERY  1992   resection, chemo   COLONOSCOPY  2007   CYSTOSCOPY  2010   EYE SURGERY  2000, 2010   cataract   mammosite balloon placement  2011   Removed in 2011   POLYPECTOMY  2003   TRANSURETHRAL RESECTION OF BLADDER TUMOR N/A 01/31/2016   Procedure: TRANSURETHRAL RESECTION OF BLADDER TUMOR (TURBT);  Surgeon: Ozell JONELLE Burkes, MD;  Location: [facility] ORS;  Service: Urology;  Laterality: N/A;    Family History: History reviewed. No pertinent family history.  Social History  reports that she quit smoking about 18 years ago. Her smoking  use included cigarettes. She has never used smokeless tobacco. She reports that she does not drink alcohol and does not use drugs.  Allergies  Allergen Reactions   Donepezil Hcl Nausea And Vomiting and Diarrhea    Medications   Current Facility-Administered Medications:    clevidipine  (CLEVIPREX ) infusion 0.5 mg/mL, 0-21 mg/hr, Intravenous, Continuous, Ernest Ronal BRAVO, MD, Last Rate: 8 mL/hr at 03/26/24 1539, 4 mg/hr at 03-26-24 1539  Current Outpatient Medications:    acetaminophen  (TYLENOL ) 500 MG tablet, Take 1,000 mg by mouth every 6 (six) hours as needed for mild pain (pain score 1-3)., Disp: , Rfl:    amLODipine  (NORVASC ) 2.5 MG tablet, Take 2.5 mg by mouth daily., Disp: , Rfl:    escitalopram  (LEXAPRO ) 5 MG tablet, Take 5 mg by mouth daily., Disp: , Rfl:    galantamine  (RAZADYNE ) 12 MG tablet, Take 12 mg by mouth 2 (two) times daily., Disp: , Rfl:    hydrochlorothiazide  (HYDRODIURIL ) 12.5 MG tablet, Take 12.5 mg by mouth daily., Disp: , Rfl:    levothyroxine  (SYNTHROID ) 75 MCG tablet, Take 1 tablet (75 mcg  total) by mouth daily before breakfast., Disp: 30 tablet, Rfl: 2   lisinopril  (ZESTRIL ) 40 MG tablet, Take 40 mg by mouth in the morning and at bedtime., Disp: , Rfl:    melatonin 3 MG TABS tablet, Take 3 mg by mouth at bedtime., Disp: , Rfl:    Multiple Vitamins-Minerals (OCUTABS) TABS, Take 1 tablet by mouth daily., Disp: , Rfl:    simvastatin  (ZOCOR ) 20 MG tablet, Take 20 mg by mouth daily., Disp: , Rfl:    trolamine salicylate (ASPERCREME) 10 % cream, Apply 1 application. topically 2 (two) times daily as needed for muscle pain., Disp: , Rfl:    metoprolol  tartrate (LOPRESSOR ) 25 MG tablet, Take 0.5 tablets (12.5 mg total) by mouth 2 (two) times daily. (Patient not taking: Reported on Mar 26, 2024), Disp: 60 tablet, Rfl: 0  Vitals   Vitals:   03-26-24 1500 03/26/24 1530 03/26/2024 1535 03/26/2024 1538  BP: (!) 168/67 (!) 200/79 (!) 184/75 (!) 187/74  Pulse: (!) 54 (!) 55 60 (!) 58  Resp: 15 18 16 15   Temp:      TempSrc:      SpO2: 97% 97% 100% 98%  Weight:      Height:        Body mass index is 21.37 kg/m.   Physical Exam   Constitutional: Appears well-developed and well-nourished.   Neurologic Examination    Neuro: Mental Status: Patient is awake, alert, she is not oriented, no signs of aphasia Cranial Nerves: II: Visual Fields are full. Pupils are equal, round, and reactive to light.   III,IV, VI: EOMI without ptosis or diploplia.  V: Facial sensation is symmetric to temperature VII: Facial movement is symmetric.  VIII: hearing is intact to voice X: Uvula elevates symmetrically XII: tongue is midline without atrophy or fasciculations.  Motor: She has severe right arm and leg weakness, but is able to move both to some degree Sensory: She reports reduced sensation in her right arm Cerebellar: No clear ataxia in the left arm, unable to perform on the right       Labs/Imaging/Neurodiagnostic studies   CBC:  Recent Labs  Lab 26-Mar-2024 1025  WBC 5.7  NEUTROABS 4.5   HGB 13.6  HCT 42.4  MCV 99.1  PLT 120*   Basic Metabolic Panel:  Lab Results  Component Value Date   NA 138 Mar 26, 2024   K 3.8 03/26/24  CO2 26 02/25/2024   GLUCOSE 100 (H) 02/25/2024   BUN 28 (H) 02/25/2024   CREATININE 0.95 02/25/2024   CALCIUM 9.2 02/25/2024   GFRNONAA 55 (L) 02/25/2024   GFRAA >60 02/02/2017   Lipid Panel:  Lab Results  Component Value Date   LDLCALC 54 09/01/2021   HgbA1c:  Lab Results  Component Value Date   HGBA1C 5.8 (H) 09/01/2021   Urine Drug Screen: No results found for: LABOPIA, COCAINSCRNUR, LABBENZ, AMPHETMU, THCU, LABBARB  Alcohol Level No results found for: [facility] INR  Lab Results  Component Value Date   INR 1.1 02/25/2024   APTT  Lab Results  Component Value Date   APTT 31 02/25/2024     ASSESSMENT   Leslie Osborn is a 89 y.o. female with a history of atrial fibrillation not on anticoagulation who presents with intraparenchymal hematoma.  Possible etiologies include amyloid angiopathy, ischemic stroke with hemorrhagic conversion, less likely hypertensive.  She will need acute control of her blood pressure, and I suspect she will have significant deficits moving forward.  I will do an MRI to assess for surrounding ischemia to suggest hemorrhagic conversion, and this will also serve as a stability follow-up.  RECOMMENDATIONS  MRI brain No antiplatelets or anticoagulants Maintain BP 130-150 currently using Cleviprex  and my recommendation PT, OT, ST  ______________________________________________________________________   This patient is critically ill and at significant risk of neurological worsening, death and care requires constant monitoring of vital signs, hemodynamics,respiratory and cardiac monitoring, neurological assessment, discussion with family, other specialists and medical decision making of high complexity. I spent 38 minutes of neurocritical care time  in the care of  this patient. This was time  spent independent of any time provided by nurse practitioner or PA.  Aisha Seals, MD [facility]   If 7pm- 7am, please page neurology on call as listed in AMION. 02/25/2024  9:09 PM

## 2024-02-25 NOTE — Plan of Care (Signed)
  Problem: Education: Goal: Knowledge of disease or condition will improve 02/25/2024 1823 by Tamela Bonine, RN Outcome: Progressing 02/25/2024 1817 by Tamela Bonine, RN Outcome: Progressing Goal: Knowledge of secondary prevention will improve (MUST DOCUMENT ALL) 02/25/2024 1823 by Tamela Bonine, RN Outcome: Progressing 02/25/2024 1817 by Tamela Bonine, RN Outcome: Progressing Goal: Knowledge of patient specific risk factors will improve (DELETE if not current risk factor) 02/25/2024 1823 by Tamela Bonine, RN Outcome: Progressing 02/25/2024 1817 by Tamela Bonine, RN Outcome: Progressing   Problem: Intracerebral Hemorrhage Tissue Perfusion: Goal: Complications of Intracerebral Hemorrhage will be minimized 02/25/2024 1823 by Tamela Bonine, RN Outcome: Progressing 02/25/2024 1817 by Tamela Bonine, RN Outcome: Progressing   Problem: Coping: Goal: Will verbalize positive feelings about self 02/25/2024 1823 by Tamela Bonine, RN Outcome: Progressing 02/25/2024 1817 by Tamela Bonine, RN Outcome: Progressing Goal: Will identify appropriate support needs 02/25/2024 1823 by Tamela Bonine, RN Outcome: Progressing 02/25/2024 1817 by Tamela Bonine, RN Outcome: Progressing   Problem: Health Behavior/Discharge Planning: Goal: Ability to manage health-related needs will improve Outcome: Progressing Goal: Goals will be collaboratively established with patient/family Outcome: Progressing   Problem: Self-Care: Goal: Ability to participate in self-care as condition permits will improve Outcome: Progressing

## 2024-02-25 NOTE — Plan of Care (Signed)
  Problem: Education: Goal: Knowledge of disease or condition will improve Outcome: Progressing Goal: Knowledge of secondary prevention will improve (MUST DOCUMENT ALL) Outcome: Progressing Goal: Knowledge of patient specific risk factors will improve (DELETE if not current risk factor) Outcome: Progressing   Problem: Intracerebral Hemorrhage Tissue Perfusion: Goal: Complications of Intracerebral Hemorrhage will be minimized Outcome: Progressing   Problem: Coping: Goal: Will verbalize positive feelings about self Outcome: Progressing Goal: Will identify appropriate support needs Outcome: Progressing   Problem: Health Behavior/Discharge Planning: Goal: Ability to manage health-related needs will improve Outcome: Progressing Goal: Goals will be collaboratively established with patient/family Outcome: Progressing   Problem: Self-Care: Goal: Ability to participate in self-care as condition permits will improve Outcome: Progressing

## 2024-02-25 NOTE — H&P (Signed)
 NAME:  Leslie Osborn, MRN:  [id number], DOB:  [birth year], LOS: 0 ADMISSION DATE:  02/25/2024, CONSULTATION DATE:  02/25/2024 REFERRING MD:  Dr. Ernest, CHIEF COMPLAINT:  Fall, right sided weakness   Brief Pt Description / Synopsis:  89 y.o. female with PMHx significant for Atrial Fibrillation not on anticoagulation due to fall risk and Alzheimer's, presenting status post fall and acute onset right sided hemiparesis.  Found to have small Intracerebral Hemorrhage requiring Cleviprex  infusion.  History of Present Illness:  Leslie Osborn is a 89 y.o. female with PMHx significant for Atrial Fibrillation not on anticoagulation due to fall risk and dementia, presenting to [facility] ED on 02/25/24 from [facility] status post unwitnessed fall and acute onset of right sided hemiparesis.  Her last known well time was approximately 14:00 yesterday.  In the ED there was concern for possible stroke which was discussed with Neurology.  She was outside the window for TNK and given her age and dementia history, she was deemed not to be a potential candidate for thrombectomy.  ED Course: Initial Vital Signs: Temperature 97.5 F, pulse 50, RR 16, BP 194/84, SpO2 96% on room air Significant Labs: platelets 120, glucose 100 Imaging Chest X-ray>>IMPRESSION: No active disease. Aortic Atherosclerosis X-ray Pelvis>>FINDINGS: There is no evidence of pelvic fracture or diastasis. No pelvic bone lesions are seen. CT Cervical Spine>>IMPRESSION: Degenerative change in the cervical spine without acute fracture or subluxation. CT angio Head & Neck>>IMPRESSION: 1. Acute parenchymal hematoma in the posterior left frontal lobe with mild surrounding edema and local mass effect. No midline shift. 2. No large vessel occlusion. 3. Calcified atherosclerosis at the left carotid bifurcation resulting in approximately 60% stenosis at the origin of the left cervical ICA. Medications Administered: Cleviprex  infusion  initiated   PCCM asked to admit for further workup and treatment.  Neurology consulted, ok to admit to [facility] ICU with strict BP control.   Please see Significant Hospital Events section below for full detailed hospital course.   Pertinent  Medical History   Past Medical History:  Diagnosis Date   Alzheimer disease  [facility])    Bladder cancer (HCC) 2009   Cancer of breast (HCC) 2011   R-Breast DCIS, ER/PR negative   Carcinoma in situ of breast 2011   R-breast   Cataract extraction status of left eye    Cataract extraction status of right eye    Cervical cancer (HCC) ?   Colon cancer (HCC) 1992   chemo   Family history of adverse reaction to anesthesia    sister got nauseated after surgery many years ago.   Hypertension 1991   Hypothyroidism    Macular degeneration 2012   Mammographic microcalcification    Restless legs syndrome     Micro Data:  9/2: MRSA PCR>>negative  Antimicrobials:   Anti-infectives (From admission, onward)    None       Significant Hospital Events: Including procedures, antibiotic start and stop dates in addition to other pertinent events   9/2: Presented to ED following fall and right sided weakness.  Found to have small ICH, evaluated by Neurology, ok to admit to [facility] ICU with strict BP control utilizing Cleviprex .   Interim History / Subjective:  As outlined above under Significant Hospital Events section  Objective   Blood pressure 137/65, pulse (!) 53, temperature 97.7 F (36.5 C), temperature source Oral, resp. rate 17, height 5' 4 (1.626 m), weight 56.5 kg, SpO2 96%.        Intake/Output  Summary (Last 24 hours) at 02/25/2024 1553 Last data filed at 02/25/2024 1552 Gross per 24 hour  Intake 4.6 ml  Output --  Net 4.6 ml   Filed Weights   02/25/24 1015  Weight: 56.5 kg    Examination: General: Acute on chronically ill appearing female, laying in bed, on room air, in NAD HENT: Atraumatic, normocephalic, neck supple, no JVD,  PERRLA Lungs: Clear breath sounds throughout, even, nonlabored, normal effort Cardiovascular: Irregularly irregular rhythm, rate controlled, no M/R/G Abdomen: Soft, nontender, nondistended, no guarding or rebound tenderness Extremities: Normal bulk and tone, no deformities, no edema, good peripheral circulation Neuro: Awake and alert, oriented only to self, with right hemiparesis GU: deferred   Resolved Hospital Problem list     Assessment & Plan:   #Acute Intracerebral Hemorrhage  #Acute Metabolic Encephalopathy #Dementia at baseline CTa Head & Neck 9/2 : Acute parenchymal hematoma in the posterior left frontal lobe measuring 2.1 x 2.3 x 3.0 cm, mild surrounding edema and local mass effect, but no midline shift -Treatment of metabolic derangements as outlined below -Provide supportive care -Promote normal sleep/wake cycle and family presence -Avoid sedating medications as able -ICU monitoring  -Frequent Neuro checks per ICH protocol -Strict BP Control: Maintain SBP 130-150 -Cleviprex  infusion to maintain BP goal -Neurology following, appreciate input -MRI Brain pending  -Obtain stat head CT for any change in neurological exam -No antiplatelets or anticoagulation  -SCDs for DVT prophylaxis -N.p.o., bedside swallow study pending -PT/OT/speech therapy consult -Echocardiogram is pending  #Hypertension #Atrial Fibrillation  PMHx: A.fib not on anticoagulation due to fall risk -Continuous cardiac monitoring -Maintain SBP goal as above for ICH -Cleviprex  infusion to maintain BP goal -HS Troponin negative x2 -Echocardiogram pending -Anticoagulation contraindicated due to ICH   #Thrombocytopenia  -Monitor for S/Sx of bleeding -Trend CBC -SCD's for VTE Prophylaxis  -Transfuse for Hgb <7 -Transfuse Platelets for Platelet count <10K; <50 K with active bleeding; < 100K for Neurosurgical processes/procedures -Add on Peripheral Smear     Pt is critically ill with multiorgan  failure. Prognosis is guarded, high risk for further decompensation, cardiac arrest, and death.  Given current critical illness superimposed on multiple chronic co-morbidities and advanced age, overall long term prognosis is poor.  Pt is  DNR/DNI status as confirmed by Most Form at bedside along with confirmation by pt's sister at bedside.   Depending on clinical course, may need to consult Palliative Care to assist with GOC discussions.   Best Practice (right click and Reselect all SmartList Selections daily)   Diet/type: NPO DVT prophylaxis: SCD GI prophylaxis: PPI Lines: N/A Foley:  N/A Code Status:  DNR Last date of multidisciplinary goals of care discussion [N/A]  9/2: Pt and her sister updated at bedside on plan of care.  Labs   CBC: Recent Labs  Lab 02/25/24 1025  WBC 5.7  NEUTROABS 4.5  HGB 13.6  HCT 42.4  MCV 99.1  PLT 120*    Basic Metabolic Panel: Recent Labs  Lab 02/25/24 1025  NA 138  K 3.8  CL 99  CO2 26  GLUCOSE 100*  BUN 28*  CREATININE 0.95  CALCIUM 9.2   GFR: Estimated Creatinine Clearance: 29.9 mL/min (by C-G formula based on SCr of 0.95 mg/dL). Recent Labs  Lab 02/25/24 1025  WBC 5.7    Liver Function Tests: Recent Labs  Lab 02/25/24 1025  AST 29  ALT 11  ALKPHOS 53  BILITOT 0.8  PROT 6.7  ALBUMIN 3.7   No results for input(s):  LIPASE, AMYLASE in the last 168 hours. No results for input(s): AMMONIA in the last 168 hours.  ABG No results found for: PHART, PCO2ART, PO2ART, HCO3, TCO2, ACIDBASEDEF, O2SAT   Coagulation Profile: Recent Labs  Lab 02/25/24 1025  INR 1.1    Cardiac Enzymes: Recent Labs  Lab 02/25/24 1025  CKTOTAL 57    HbA1C: Hgb A1c MFr Bld  Date/Time Value Ref Range Status  09/01/2021 03:54 AM 5.8 (H) 4.8 - 5.6 % Final    Comment:    (NOTE)         Prediabetes: 5.7 - 6.4         Diabetes: >6.4         Glycemic control for adults with diabetes: <7.0     CBG: No results for  input(s): GLUCAP in the last 168 hours.  Review of Systems:   Unable to assess due to AMS   Past Medical History:  She,  has a past medical history of Alzheimer disease (HCC), Bladder cancer (HCC) (2009), Cancer of breast (HCC) (2011), Carcinoma in situ of breast (2011), Cataract extraction status of left eye, Cataract extraction status of right eye, Cervical cancer (HCC) (?), Colon cancer (HCC) (1992), Family history of adverse reaction to anesthesia, Hypertension (1991), Hypothyroidism, Macular degeneration (2012), Mammographic microcalcification, and Restless legs syndrome.   Surgical History:   Past Surgical History:  Procedure Laterality Date   ABDOMINAL HYSTERECTOMY  1972   APPENDECTOMY     BLADDER SURGERY  2009   BREAST BIOPSY  1960   BREAST BIOPSY Right 2011   positive- radiation   BREAST CYST EXCISION Right    BREAST SURGERY Right 11/15/09   lumpectomy   COLON SURGERY  1992   resection, chemo   COLONOSCOPY  2007   CYSTOSCOPY  2010   EYE SURGERY  2000, 2010   cataract   mammosite balloon placement  2011   Removed in 2011   POLYPECTOMY  2003   TRANSURETHRAL RESECTION OF BLADDER TUMOR N/A 01/31/2016   Procedure: TRANSURETHRAL RESECTION OF BLADDER TUMOR (TURBT);  Surgeon: Ozell JONELLE Burkes, MD;  Location: [facility] ORS;  Service: Urology;  Laterality: N/A;     Social History:   reports that she quit smoking about 18 years ago. Her smoking use included cigarettes. She has never used smokeless tobacco. She reports that she does not drink alcohol and does not use drugs.   Family History:  Her family history is not on file.   Allergies Allergies  Allergen Reactions   Donepezil Hcl Nausea And Vomiting and Diarrhea     Home Medications  Prior to Admission medications   Medication Sig Start Date End Date Taking? Authorizing Provider  acetaminophen  (TYLENOL ) 500 MG tablet Take 1,000 mg by mouth every 6 (six) hours as needed for mild pain (pain score 1-3).   Yes [provider]  amLODipine  (NORVASC ) 2.5 MG tablet Take 2.5 mg by mouth daily.   Yes [provider]  escitalopram  (LEXAPRO ) 5 MG tablet Take 5 mg by mouth daily.   Yes [provider]  galantamine  (RAZADYNE ) 12 MG tablet Take 12 mg by mouth 2 (two) times daily.   Yes [provider]  hydrochlorothiazide  (HYDRODIURIL ) 12.5 MG tablet Take 12.5 mg by mouth daily. 07/07/21  Yes [provider]  levothyroxine  (SYNTHROID ) 75 MCG tablet Take 1 tablet (75 mcg total) by mouth daily before breakfast. 09/03/21  Yes Patel, Sona, MD  lisinopril  (ZESTRIL ) 40 MG tablet Take 40 mg by mouth in  the morning and at bedtime.   Yes [provider]  melatonin 3 MG TABS tablet Take 3 mg by mouth at bedtime.   Yes [provider]  Multiple Vitamins-Minerals (OCUTABS) TABS Take 1 tablet by mouth daily.   Yes [provider]  simvastatin  (ZOCOR ) 20 MG tablet Take 20 mg by mouth daily.   Yes [provider]  trolamine salicylate (ASPERCREME) 10 % cream Apply 1 application. topically 2 (two) times daily as needed for muscle pain.   Yes [provider]  metoprolol  tartrate (LOPRESSOR ) 25 MG tablet Take 0.5 tablets (12.5 mg total) by mouth 2 (two) times daily. Patient not taking: Reported on 02/25/2024 09/02/21   Patel, Sona, MD     Critical care time: 50 minutes     Inge Lecher, AGACNP-BC [facility] Prefer epic messenger for cross cover needs If after hours, please call E-link

## 2024-02-25 NOTE — Progress Notes (Signed)
 MRI reached out to possibly perform routine scan of the brain tonight for this patient (stroke follow-up). Spoke with Britton-Lee, ICU NP, and agreed that due to the history of dementia with current sundowning and agitation, to defer the scan until morning. Patient has had no change in neuro status or NIHSS. Informed MRI staff of this updated plan.  Arlean FORBES Bowers, RN

## 2024-02-25 NOTE — ED Provider Notes (Signed)
 [facility] Provider Note    Event Date/Time   First MD Initiated Contact with Patient 02/25/24 1005     (approximate)   History   Fall   HPI  Leslie Osborn is a 89 y.o. female with history of A-fib who comes in with concerns for fall.  I reviewed a note from 11/15/2023 where patient was seen by cardiology for her A-fib.  She is on metoprolol .  It looks like she is on 12.5 twice daily.  She is not on any blood thinners due to high risk of falling.  Attempted calling Ms Benjamine- no answer.  Tried calling Tom bland no answer.  Per family she was normal at 2pm.   Patient's sister is now at bedside he does report that she has never had the right sided weakness in the past.  She reports at 2 PM yesterday she seemed to be normal but unclear if anyone saw her normal after that.   Physical Exam   Triage Vital Signs: ED Triage Vitals  Encounter Vitals Group     BP 02/25/24 1014 (!) 194/84     Girls Systolic BP Percentile --      Girls Diastolic BP Percentile --      Boys Systolic BP Percentile --      Boys Diastolic BP Percentile --      Pulse Rate 02/25/24 1009 (!) 50     Resp 02/25/24 1009 16     Temp 02/25/24 1009 (!) 97.5 F (36.4 C)     Temp Source 02/25/24 1009 Oral     SpO2 02/25/24 1009 96 %     Weight --      Height --      Head Circumference --      Peak Flow --      Pain Score --      Pain Loc --      Pain Education --      Exclude from Growth Chart --     Most recent vital signs: Vitals:   02/25/24 1009 02/25/24 1014  BP:  (!) 194/84  Pulse: (!) 50 (!) 42  Resp: 16 17  Temp: (!) 97.5 F (36.4 C) (!) 97.5 F (36.4 C)  SpO2: 96% 96%     General: Awake, no distress.  CV:  Good peripheral perfusion.  Resp:  Normal effort.  Abd:  No distention.  Other:  Patient is alert and oriented x 1 with weakness on the right side.  Her speech does not sound confused aphasic but patient does have some confusion is alert and oriented x 1  secondary to dementia.   ED Results / Procedures / Treatments   Labs (all labs ordered are listed, but only abnormal results are displayed) Labs Reviewed  CBC WITH DIFFERENTIAL/PLATELET - Abnormal; Notable for the following components:      Result Value   Platelets 120 (*)    All other components within normal limits  PROTIME-INR  APTT  COMPREHENSIVE METABOLIC PANEL WITH GFR  CK  TROPONIN I (HIGH SENSITIVITY)     EKG  My interpretation of EKG:  Atrial fibrillation with a rate of 48 without any ST elevation or T wave inversions with left bundle branch block.  This is similar to prior EKG  RADIOLOGY I have reviewed the xray personally and interpreted no evidence of any pneumonia   PROCEDURES:  Critical Care performed: No  .1-3 Lead EKG Interpretation  Performed by: Ernest Ronal BRAVO, MD Authorized  by: Ernest Ronal BRAVO, MD     Interpretation: abnormal     ECG rate:  40   ECG rate assessment: normal     Rhythm: atrial fibrillation     Ectopy: none     Conduction: normal      MEDICATIONS ORDERED IN ED: Medications  clevidipine  (CLEVIPREX ) infusion 0.5 mg/mL (has no administration in time range)  iohexol  (OMNIPAQUE ) 350 MG/ML injection 75 mL (75 mLs Intravenous Contrast Given 02/25/24 1455)     IMPRESSION / MDM / ASSESSMENT AND PLAN / ED COURSE  I reviewed the triage vital signs and the nursing notes.   Patient's presentation is most consistent with acute presentation with potential threat to life or bodily function.   Patient comes in with a fall we will check blood work to evaluate for rhabdo, Electra abnormalities, AKI however on examination I am concerned about the possibility of stroke.  Scan new right-sided weakness will discuss with neurology given she is at the window for TNK and given her history of dementia although she ambulates with a walker she has only alert and oriented x 1 and I am not sure if we should activate her as a code stroke for potential  thrombectomy.  Discussed with Dr Elwood patient's age, predisposing mortality and high modified Rankin score patient would not be a thrombectomy candidate and therefore holding off on activity stroke code but will proceed with stroke workup.  Patient was out of the window for TNK.   Delay in CT imaging secondary to patient having to use the bathroom every time they tried to take her.  CT imaging is consistent with intraparenchymal hemorrhage.  Patient will be started on Cleviprex  drip per neurology with goal of blood pressure 130-150.  I did a repeat evaluation and updated patient's sister.  She remains neuro intact other than the right sided weakness but otherwise pleasant.  I will discussed with the ICU for admission  The patient is on the cardiac monitor to evaluate for evidence of arrhythmia and/or significant heart rate changes.      FINAL CLINICAL IMPRESSION(S) / ED DIAGNOSES   Final diagnoses:  Intraparenchymal hemorrhage of brain (HCC)  Hypertension, unspecified type     Rx / DC Orders   ED Discharge Orders     None        Note:  This document was prepared using Dragon voice recognition software and may include unintentional dictation errors.   Ernest Ronal BRAVO, MD 02/25/24 [id number]

## 2024-02-25 NOTE — ED Triage Notes (Signed)
 Pt to ED via [facility] from the [facility] for c/o unwitnessed fall. LKW 1400 yesterday. Pt found in floor by bed this morning around 900. Pt unable to use right arm or leg, per EMS unsure if this is baseline. Pt not on blood thinners. Pt has hx dementia, disoriented x4.

## 2024-02-26 ENCOUNTER — Inpatient Hospital Stay

## 2024-02-26 ENCOUNTER — Inpatient Hospital Stay
Admit: 2024-02-26 | Discharge: 2024-02-26 | Disposition: A | Discharge status: Home / Self Care | Attending: Pulmonary Disease

## 2024-02-26 DIAGNOSIS — I10 Essential (primary) hypertension: Secondary | ICD-10-CM

## 2024-02-26 DIAGNOSIS — I340 Nonrheumatic mitral (valve) insufficiency: Secondary | ICD-10-CM

## 2024-02-26 DIAGNOSIS — G9341 Metabolic encephalopathy: Secondary | ICD-10-CM | POA: Diagnosis not present

## 2024-02-26 DIAGNOSIS — I619 Nontraumatic intracerebral hemorrhage, unspecified: Secondary | ICD-10-CM

## 2024-02-26 LAB — RENAL FUNCTION PANEL
Albumin: 3.6 g/dL (ref 3.5–5.0)
Anion gap: 11 (ref 5–15)
BUN: 28 mg/dL — ABNORMAL HIGH (ref 8–23)
CO2: 28 mmol/L (ref 22–32)
Calcium: 9.3 mg/dL (ref 8.9–10.3)
Chloride: 100 mmol/L (ref 98–111)
Creatinine, Ser: 1.01 mg/dL — ABNORMAL HIGH (ref 0.44–1.00)
GFR, Estimated: 51 mL/min — ABNORMAL LOW (ref >60)
Glucose, Bld: 90 mg/dL (ref 70–99)
Phosphorus: 3.5 mg/dL (ref 2.5–4.6)
Potassium: 3.9 mmol/L (ref 3.5–5.1)
Sodium: 139 mmol/L (ref 135–145)

## 2024-02-26 LAB — ECHOCARDIOGRAM COMPLETE
AR max vel: 0.55 cm2
AV Area VTI: 0.58 cm2
AV Area mean vel: 0.51 cm2
AV Mean grad: 36.7 mmHg
AV Peak grad: 63.8 mmHg
Ao pk vel: 3.99 m/s
Area-P 1/2: 3.42 cm2
Calc EF: 51.2 %
Height: 64 in
MV VTI: 1.11 cm2
S' Lateral: 2.3 cm
Single Plane A2C EF: 45.5 %
Single Plane A4C EF: 56.3 %
Weight: 1992 [oz_av]

## 2024-02-26 LAB — CBC
HCT: 40.1 % (ref 36.0–46.0)
Hemoglobin: 13.6 g/dL (ref 12.0–15.0)
MCH: 32.4 pg (ref 26.0–34.0)
MCHC: 33.9 g/dL (ref 30.0–36.0)
MCV: 95.5 fL (ref 80.0–100.0)
Platelets: 160 K/uL (ref 150–400)
RBC: 4.2 MIL/uL (ref 3.87–5.11)
RDW: 14.2 % (ref 11.5–15.5)
WBC: 7.3 K/uL (ref 4.0–10.5)
nRBC: 0 % (ref 0.0–0.2)

## 2024-02-26 LAB — MAGNESIUM: Magnesium: 1.9 mg/dL (ref 1.7–2.4)

## 2024-02-26 LAB — TSH: TSH: 3.187 u[IU]/mL (ref 0.350–4.500)

## 2024-02-26 MED ORDER — MELATONIN 5 MG PO TABS
5.0000 mg | ORAL_TABLET | Freq: Every day | ORAL | Status: DC
  Administered 2024-02-26 – 2024-03-01 (×5): 5 mg via ORAL
  Filled 2024-02-26 (×5): qty 1

## 2024-02-26 MED ORDER — ESCITALOPRAM OXALATE 10 MG PO TABS
5.0000 mg | ORAL_TABLET | Freq: Every day | ORAL | Status: DC
  Administered 2024-02-26 – 2024-03-02 (×6): 5 mg via ORAL
  Filled 2024-02-26: qty 1
  Filled 2024-02-26: qty 0.5
  Filled 2024-02-26 (×3): qty 1
  Filled 2024-02-26: qty 0.5

## 2024-02-26 MED ORDER — LEVOTHYROXINE SODIUM 50 MCG PO TABS
75.0000 ug | ORAL_TABLET | Freq: Every day | ORAL | Status: DC
  Administered 2024-02-27 – 2024-03-02 (×5): 75 ug via ORAL
  Filled 2024-02-26: qty 2
  Filled 2024-02-26 (×4): qty 1

## 2024-02-26 MED ORDER — SIMVASTATIN 20 MG PO TABS
20.0000 mg | ORAL_TABLET | Freq: Every day | ORAL | Status: DC
  Administered 2024-02-26 – 2024-03-02 (×6): 20 mg via ORAL
  Filled 2024-02-26 (×6): qty 1

## 2024-02-26 MED ORDER — HYDROCHLOROTHIAZIDE 12.5 MG PO TABS
12.5000 mg | ORAL_TABLET | Freq: Every day | ORAL | Status: DC
  Administered 2024-02-26 – 2024-03-02 (×6): 12.5 mg via ORAL
  Filled 2024-02-26 (×6): qty 1

## 2024-02-26 MED ORDER — AMLODIPINE BESYLATE 5 MG PO TABS
2.5000 mg | ORAL_TABLET | Freq: Every day | ORAL | Status: DC
  Administered 2024-02-26 – 2024-03-02 (×6): 2.5 mg via ORAL
  Filled 2024-02-26 (×6): qty 1

## 2024-02-26 MED ORDER — HYDRALAZINE HCL 20 MG/ML IJ SOLN
10.0000 mg | INTRAMUSCULAR | Status: DC | PRN
  Administered 2024-02-27 – 2024-02-28 (×2): 20 mg via INTRAVENOUS
  Filled 2024-02-26 (×2): qty 1

## 2024-02-26 NOTE — Care Management Important Message (Signed)
 Important Message  Patient Details  Name: Leslie Osborn MRN: [id number] Date of Birth: [birth year]   Important Message Given: left physical copy in the room with patient, TC to sister Michaelle Leech [phone] and went over IMM with her, wasn't sure patient understood.        [name] 02/26/2024, 11:43 AM

## 2024-02-26 NOTE — Evaluation (Signed)
 Clinical/Bedside Swallow Evaluation Patient Details  Name: Leslie Osborn MRN: [id number] Date of Birth: 10-27-1925  Today's Date: 02/26/2024 Time: SLP Start Time (ACUTE ONLY): 1130 SLP Stop Time (ACUTE ONLY): 1230 SLP Time Calculation (min) (ACUTE ONLY): 60 min  Past Medical History:  Past Medical History:  Diagnosis Date   Alzheimer disease (HCC)    Bladder cancer (HCC) 2009   Cancer of breast (HCC) 2011   R-Breast DCIS, ER/PR negative   Carcinoma in situ of breast 2011   R-breast   Cataract extraction status of left eye    Cataract extraction status of right eye    Cervical cancer (HCC) ?   Colon cancer (HCC) 1992   chemo   Family history of adverse reaction to anesthesia    sister got nauseated after surgery many years ago.   Hypertension 1991   Hypothyroidism    Macular degeneration 2012   Mammographic microcalcification    Restless legs syndrome    Past Surgical History:  Past Surgical History:  Procedure Laterality Date   ABDOMINAL HYSTERECTOMY  1972   APPENDECTOMY     BLADDER SURGERY  2009   BREAST BIOPSY  1960   BREAST BIOPSY Right 2011   positive- radiation   BREAST CYST EXCISION Right    BREAST SURGERY Right 11/15/09   lumpectomy   COLON SURGERY  1992   resection, chemo   COLONOSCOPY  2007   CYSTOSCOPY  2010   EYE SURGERY  2000, 2010   cataract   mammosite balloon placement  2011   Removed in 2011   POLYPECTOMY  2003   TRANSURETHRAL RESECTION OF BLADDER TUMOR N/A 01/31/2016   Procedure: TRANSURETHRAL RESECTION OF BLADDER TUMOR (TURBT);  Surgeon: Ozell JONELLE Burkes, MD;  Location: [facility] ORS;  Service: Urology;  Laterality: N/A;   HPI:  Pt is a 89 y.o. female with PMHx significant for Alzheimer's Dementia, Atrial Fibrillation not on anticoagulation due to fall risk and dementia, HTN, Macular Degeneration, presenting to [facility] ED on 02/25/24 from [facility] status post unwitnessed fall and acute onset of right sided hemiparesis.  Per ED note,  there was concern for possible stroke which was discussed with Neurology.  She was outside the window for TNK and given her age and Dementia history, she was deemed not to be a potential candidate for thrombectomy.   MRI: Acute parenchymal hematoma in the posterior left frontal lobe involving the  left precentral gyrus. Mild surrounding edema and local mass effect. No midline  shift.  2. Punctate foci of susceptibility within the pons, possibly related to  hypertension.  3. Chronic microangiopathic ischemic changes.  4. Mild parenchymal volume loss.   CXR: No active disease.    Assessment / Plan / Recommendation  Clinical Impression   Pt seen for BSE. Pt resting in bed; awake. She responded to social verbal engagement; often saying thank you to each bolus given. Did not follow 1-step instructions consistently. She required Full feeding support. Noted Dentures. Pt has Baseline dx of Dementia. Encouragement required initially for pt to participate in po intake/trials w/ this SLP.  On RA; afebrile. WBC wnl.  Pt appears to present w/ functional oropharyngeal phase swallowing w/ trial consistencies given, in setting of declined Cognitive status; Baseline Dementia. Pt resides at a [facility]- unsure of pt's oral management and intake of solid foods there. ANY Cognitive decline can impact overall awareness/engagement w/ oral intake as well as timing of swallow and safety during oral intake  which increases risk for dysphagia. Pt's risk for aspiration can be reduced when following general aspiration precautions, given full feeding assistance/support, and using a modified diet consistency of Pureed foods, thin liquids. Encouragement w/ oral intake is indicated also.      Pt consumed several trials of ice chips, purees, and thin liquids via Straw w/ No overt clinical s/s of aspiration noted: no decline in vocal quality, no cough, and no decline in respiratory status during/post trials. O2 sats remained  upper 90s during. Oral phase was functional for bolus management and oral clearing of the boluses given. She did not attempt self-feeding.  OM Exam was cursory d/t pt's participation but appeared grossly [facility] w/ No unilateral weakness noted. Some confusion of OM tasks and oral care noted. Pt was wearing full Dentures- encouraged removal and cleaning post meals to the NSG staff.   In setting of baseline Dementia and Cognitive decline w/ acute hospitalization, recommend initiation of the dysphagia level 1(Pureed foods) moistened for ease of oral phase; thin liquids. Recommend general aspiration precautions; reduce Distractions during meals and engage pt in self-feeding. Pills Crushed in Puree for safer swallowing as needed. Tray setup and Support w/ feeding at meals. Small bites/sips Slowly. MD/NSG updated.  ST services recommends follow w/ Palliative Care for GOC and education re: impact of Cognitive decline/Dementia on swallowing and oral intake in general. Suspect pt is close to/at her baseline. ST services will f/u w/ toleration of diet and trials to upgrade diet if appropriate. Precautions posted in room, chart. SLP Visit Diagnosis: Dysphagia, oral phase (R13.11) (impact from Alzheimer's Dementia; not feeding self)    Aspiration Risk  Mild aspiration risk;Risk for inadequate nutrition/hydration (reduced following general aspiration precautions)    Diet Recommendation   Thin;Dysphagia 1 (puree) (gravies) = dysphagia level 1(Pureed foods) moistened for ease of oral phase; thin liquids. Recommend general aspiration precautions; reduce Distractions during meals and engage pt in self-feeding. Tray setup and Support w/ feeding at meals. Small bites/sips Slowly.   Medication Administration: Crushed with puree    Other  Recommendations Recommended Consults:  (Dietician f/u; Palliative Care f/u) Oral Care Recommendations: Oral care BID;Oral care before and after PO;Staff/trained caregiver to provide oral  care (denture care)     Assistance Recommended at Discharge  FULL  Functional Status Assessment Patient has had a recent decline in their functional status and/or demonstrates limited ability to make significant improvements in function in a reasonable and predictable amount of time  Frequency and Duration min 2x/week  2 weeks       Prognosis Prognosis for improved oropharyngeal function: Fair Barriers to Reach Goals: Cognitive deficits;Language deficits;Time post onset;Severity of deficits;Motivation Barriers/Prognosis Comment: impact from Alzheimer's Dementia; not feeding self      Swallow Study   General Date of Onset: 02/25/24 HPI: Pt is a 89 y.o. female with PMHx significant for Alzheimer's Dementia, Atrial Fibrillation not on anticoagulation due to fall risk and dementia, HTN, Macular Degeneration, presenting to [facility] ED on 02/25/24 from [facility] status post unwitnessed fall and acute onset of right sided hemiparesis.  Per ED note, there was concern for possible stroke which was discussed with Neurology.  She was outside the window for TNK and given her age and Dementia history, she was deemed not to be a potential candidate for thrombectomy.   MRI: Acute parenchymal hematoma in the posterior left frontal lobe involving the  left precentral gyrus. Mild surrounding edema and local mass effect. No midline  shift.  2. Punctate foci of susceptibility within the pons, possibly related to  hypertension.  3. Chronic microangiopathic ischemic changes.  4. Mild parenchymal volume loss.   CXR: No active disease. Type of Study: Bedside Swallow Evaluation Previous Swallow Assessment: none Diet Prior to this Study: NPO Temperature Spikes Noted: No (wbc 7.3) Respiratory Status: Room air History of Recent Intubation: No Behavior/Cognition: Alert;Cooperative;Pleasant mood;Confused;Distractible;Requires cueing Oral Cavity Assessment: Within Functional Limits (limited d/t pt's  cooperation) Oral Care Completed by SLP: Yes (attempted) Oral Cavity - Dentition: Dentures, top;Dentures, bottom Vision:  (n/a) Self-Feeding Abilities: Total assist (did not attempt to feed self) Patient Positioning: Upright in bed (full assist) Baseline Vocal Quality: Normal Volitional Cough: Cognitively unable to elicit Volitional Swallow: Unable to elicit    Oral/Motor/Sensory Function Overall Oral Motor/Sensory Function: Within functional limits (no unilateral weakness noted)   Ice Chips Ice chips: Within functional limits Presentation: Spoon (fed; 3 trials)   Thin Liquid Thin Liquid: Within functional limits Presentation: Straw (fed and monitored straw sips; 10+)    Nectar Thick Nectar Thick Liquid: Not tested   Honey Thick Honey Thick Liquid: Not tested   Puree Puree: Within functional limits Presentation: Spoon (fed; 10 trials)   Solid     Solid: Not tested         Comer Portugal, MS, CCC-SLP Speech Language Pathologist Rehab Services; [facility] [phone] (ascom) Jaice Lague 02/26/2024,3:09 PM

## 2024-02-26 NOTE — TOC Progression Note (Signed)
 Transition of Care [facility]) - Progression Note    Patient Details  Name: Leslie Osborn MRN: [id number] Date of Birth: [birth year]  Transition of Care [facility]) CM/SW Contact  K'La JINNY Ruts, LCSW Phone Number: 02/26/2024, 11:40 AM  Clinical Narrative:    Chart reviewed. I called the patient sister, Roderick Leech and there was no answer. I left a message for the patient sister. Waiting for a call back to complete consult.                      Expected Discharge Plan and Services                                               Social Drivers of Health (SDOH) Interventions SDOH Screenings   Food Insecurity: Patient Unable To Answer (02/26/2024)  Housing: Patient Unable To Answer (02/26/2024)  Transportation Needs: Patient Unable To Answer (02/26/2024)  Utilities: Patient Unable To Answer (02/26/2024)  Social Connections: Unknown (02/26/2024)  Tobacco Use: Medium Risk (02/25/2024)    Readmission Risk Interventions     No data to display

## 2024-02-26 NOTE — Plan of Care (Signed)

## 2024-02-26 NOTE — Care Management Important Message (Signed)
 Important Message  Patient Details  Name: Leslie Osborn MRN: [id number] Date of Birth: 08-24-1925   Important Message Given:  Yes - Medicare IM     [name] 02/26/2024, 11:44 AM

## 2024-02-26 NOTE — Progress Notes (Signed)
 NAME:  Leslie Osborn, MRN:  [id number], DOB:  1926-06-17, LOS: 1 ADMISSION DATE:  02/25/2024, CONSULTATION DATE:  02/25/2024 REFERRING MD:  Dr. Ernest, CHIEF COMPLAINT:  Fall, right sided weakness   Brief Pt Description / Synopsis:  89 y.o. female with PMHx significant for Atrial Fibrillation not on anticoagulation due to fall risk and Alzheimer's, presenting status post fall and acute onset right sided hemiparesis.  Found to have small Intracerebral Hemorrhage requiring Cleviprex  infusion.  History of Present Illness:  Leslie Osborn is a 89 y.o. female with PMHx significant for Atrial Fibrillation not on anticoagulation due to fall risk and dementia, presenting to [facility] ED on 02/25/24 from [facility] status post unwitnessed fall and acute onset of right sided hemiparesis.  Her last known well time was approximately 14:00 yesterday.  In the ED there was concern for possible stroke which was discussed with Neurology.  She was outside the window for TNK and given her age and dementia history, she was deemed not to be a potential candidate for thrombectomy.  ED Course: Initial Vital Signs: Temperature 97.5 F, pulse 50, RR 16, BP 194/84, SpO2 96% on room air Significant Labs: platelets 120, glucose 100 Imaging Chest X-ray>>IMPRESSION: No active disease. Aortic Atherosclerosis X-ray Pelvis>>FINDINGS: There is no evidence of pelvic fracture or diastasis. No pelvic bone lesions are seen. CT Cervical Spine>>IMPRESSION: Degenerative change in the cervical spine without acute fracture or subluxation. CT angio Head & Neck>>IMPRESSION: 1. Acute parenchymal hematoma in the posterior left frontal lobe with mild surrounding edema and local mass effect. No midline shift. 2. No large vessel occlusion. 3. Calcified atherosclerosis at the left carotid bifurcation resulting in approximately 60% stenosis at the origin of the left cervical ICA. Medications Administered: Cleviprex  infusion  initiated   PCCM asked to admit for further workup and treatment.  Neurology consulted, ok to admit to [facility] ICU with strict BP control.   Please see Significant Hospital Events section below for full detailed hospital course.   Pertinent  Medical History   Past Medical History:  Diagnosis Date   Alzheimer disease [facility])    Bladder cancer (HCC) 2009   Cancer of breast (HCC) 2011   R-Breast DCIS, ER/PR negative   Carcinoma in situ of breast 2011   R-breast   Cataract extraction status of left eye    Cataract extraction status of right eye    Cervical cancer (HCC) ?   Colon cancer (HCC) 1992   chemo   Family history of adverse reaction to anesthesia    sister got nauseated after surgery many years ago.   Hypertension 1991   Hypothyroidism    Macular degeneration 2012   Mammographic microcalcification    Restless legs syndrome     Micro Data:  9/2: MRSA PCR>>negative  Antimicrobials:   Anti-infectives (From admission, onward)    None       Significant Hospital Events: Including procedures, antibiotic start and stop dates in addition to other pertinent events   9/2: Presented to ED following fall and right sided weakness.  Found to have small ICH, evaluated by Neurology, ok to admit to [facility] ICU with strict BP control utilizing Cleviprex .  9/3: Pt remains on Cleviprex  gtt @2mg /hr to maintain sbp 130-150.  Currently pending MRI Brain   Interim History / Subjective:  As outlined above under Significant Hospital Events section  Objective   Blood pressure (!) 149/65, pulse (!) 56, temperature 98 F (36.7 C), temperature source Axillary, resp. rate 14,  height 5' 4 (1.626 m), weight 56.5 kg, SpO2 93%.        Intake/Output Summary (Last 24 hours) at 02/26/2024 9170 Last data filed at 02/26/2024 0600 Gross per 24 hour  Intake 146.91 ml  Output 350 ml  Net -203.09 ml   Filed Weights   02/25/24 1015  Weight: 56.5 kg    Examination: General: Acute on chronically  ill appearing elderly female, NAD on RA HENT: Atraumatic, normocephalic, neck supple, no JVD, PERRLA Lungs: Clear throughout, even, non labored  Cardiovascular: Irregularly irregular rhythm, bradycardic, no m/r/g, 2+ radial/1+ distal pulses, no edema  Abdomen: +BS x4, obese, soft, non distended, non tender  Extremities: Normal bulk and tone, no deformities, moves all extremities  Neuro: Alert and oriented, follows commands, right hemiparesis, PERRLA  GU: External catheter draining yellow   Resolved Hospital Problem list    Assessment & Plan:   #Acute intracerebral hemorrhage  #Acute metabolic encephalopathy #Dementia at baseline CTA Head Neck 9/2: Acute parenchymal hematoma in the posterior left frontal lobe measuring 2.1 x 2.3 x 3.0 cm, mild surrounding edema and local mass effect, but no midline shift - Treat metabolic derangements  - Provide supportive care - Promote normal sleep/wake cycle and family presence - Sitter at bedside for safety  - Avoid sedating medications as able - Neuro checks per NIH stroke scale  - Strict BP control: maintain SBP 130-150 - Cleviprex  infusion to maintain BP goal - Neurology following, appreciate input - MRI Brain pending  - Obtain Stat CT Head for acute neurological changes  - No antiplatelets or anticoagulation  - SCD's for DVT prophylaxis - NPO until evaluated by speech therapy  - PT/OT   #HTN #Atrial Fibrillation  PMHx: A.fib not on anticoagulation due to fall risk - Continuous telemetry monitoring - Maintain SBP goal as above for ICH - Cleviprex  infusion to maintain BP goal - HS Troponin negative x2 - Anticoagulation contraindicated due to ICH   #Thrombocytopenia  - Trend CBC - Monitor for s/sx of bleeding  - SCD's for VTE prophylaxis  - Transfuse for hgb less than 7 - Transfuse platelets for platelet count <10K; <50 K with active bleeding; <100K for neurosurgical processes/procedures  #Endo  - Follow hypo/hyperglycemic  protocol  - Target CBG readings 140 to 180  Pt is critically ill with multiorgan failure. Prognosis is guarded, high risk for further decompensation, cardiac arrest, and death.  Given current critical illness superimposed on multiple chronic co-morbidities and advanced age, overall long term prognosis is poor.  Pt is  DNR/DNI status as confirmed by Most Form at bedside along with confirmation by pt's sister at bedside.  Depending on clinical course, may need to consult Palliative Care to assist with GOC discussions.  Best Practice (right click and Reselect all SmartList Selections daily)   Diet/type: NPO DVT prophylaxis: SCD GI prophylaxis: PPI Lines: N/A Foley:  N/A Code Status:  DNR Last date of multidisciplinary goals of care discussion [02/26/2024]  9/3: Will update pts family when they arrive at bedside Labs   CBC: Recent Labs  Lab 02/25/24 1025 02/26/24 0521  WBC 5.7 7.3  NEUTROABS 4.5  --   HGB 13.6 13.6  HCT 42.4 40.1  MCV 99.1 95.5  PLT 120* 160    Basic Metabolic Panel: Recent Labs  Lab 02/25/24 1025 02/26/24 0521  NA 138 139  K 3.8 3.9  CL 99 100  CO2 26 28  GLUCOSE 100* 90  BUN 28* 28*  CREATININE 0.95 1.01*  CALCIUM  9.2 9.3  MG  --  1.9  PHOS  --  3.5   GFR: Estimated Creatinine Clearance: 28.1 mL/min (A) (by C-G formula based on SCr of 1.01 mg/dL (H)). Recent Labs  Lab 02/25/24 1025 02/26/24 0521  WBC 5.7 7.3    Liver Function Tests: Recent Labs  Lab 02/25/24 1025 02/26/24 0521  AST 29  --   ALT 11  --   ALKPHOS 53  --   BILITOT 0.8  --   PROT 6.7  --   ALBUMIN 3.7 3.6   No results for input(s): LIPASE, AMYLASE in the last 168 hours. No results for input(s): AMMONIA in the last 168 hours.  ABG No results found for: PHART, PCO2ART, PO2ART, HCO3, TCO2, ACIDBASEDEF, O2SAT   Coagulation Profile: Recent Labs  Lab 02/25/24 1025  INR 1.1    Cardiac Enzymes: Recent Labs  Lab 02/25/24 1025  CKTOTAL 57     HbA1C: Hgb A1c MFr Bld  Date/Time Value Ref Range Status  09/01/2021 03:54 AM 5.8 (H) 4.8 - 5.6 % Final    Comment:    (NOTE)         Prediabetes: 5.7 - 6.4         Diabetes: >6.4         Glycemic control for adults with diabetes: <7.0     CBG: Recent Labs  Lab 02/25/24 1620  GLUCAP 96    Review of Systems:   Unable to assess due to AMS  Past Medical History:  She,  has a past medical history of Alzheimer disease (HCC), Bladder cancer (HCC) (2009), Cancer of breast (HCC) (2011), Carcinoma in situ of breast (2011), Cataract extraction status of left eye, Cataract extraction status of right eye, Cervical cancer (HCC) (?), Colon cancer (HCC) (1992), Family history of adverse reaction to anesthesia, Hypertension (1991), Hypothyroidism, Macular degeneration (2012), Mammographic microcalcification, and Restless legs syndrome.   Surgical History:   Past Surgical History:  Procedure Laterality Date   ABDOMINAL HYSTERECTOMY  1972   APPENDECTOMY     BLADDER SURGERY  2009   BREAST BIOPSY  1960   BREAST BIOPSY Right 2011   positive- radiation   BREAST CYST EXCISION Right    BREAST SURGERY Right 11/15/09   lumpectomy   COLON SURGERY  1992   resection, chemo   COLONOSCOPY  2007   CYSTOSCOPY  2010   EYE SURGERY  2000, 2010   cataract   mammosite balloon placement  2011   Removed in 2011   POLYPECTOMY  2003   TRANSURETHRAL RESECTION OF BLADDER TUMOR N/A 01/31/2016   Procedure: TRANSURETHRAL RESECTION OF BLADDER TUMOR (TURBT);  Surgeon: Ozell JONELLE Burkes, MD;  Location: [facility] ORS;  Service: Urology;  Laterality: N/A;     Social History:   reports that she quit smoking about 18 years ago. Her smoking use included cigarettes. She has never used smokeless tobacco. She reports that she does not drink alcohol and does not use drugs.   Family History:  Her family history is not on file.   Allergies Allergies  Allergen Reactions   Donepezil Hcl Nausea And Vomiting and Diarrhea      Home Medications  Prior to Admission medications   Medication Sig Start Date End Date Taking? Authorizing Provider  acetaminophen  (TYLENOL ) 500 MG tablet Take 1,000 mg by mouth every 6 (six) hours as needed for mild pain (pain score 1-3).   Yes [provider]  amLODipine  (NORVASC ) 2.5 MG tablet Take 2.5 mg by mouth  daily.   Yes [provider]  escitalopram  (LEXAPRO ) 5 MG tablet Take 5 mg by mouth daily.   Yes [provider]  galantamine  (RAZADYNE ) 12 MG tablet Take 12 mg by mouth 2 (two) times daily.   Yes [provider]  hydrochlorothiazide  (HYDRODIURIL ) 12.5 MG tablet Take 12.5 mg by mouth daily. 07/07/21  Yes [provider]  levothyroxine  (SYNTHROID ) 75 MCG tablet Take 1 tablet (75 mcg total) by mouth daily before breakfast. 09/03/21  Yes Patel, Sona, MD  lisinopril  (ZESTRIL ) 40 MG tablet Take 40 mg by mouth in the morning and at bedtime.   Yes [provider]  melatonin 3 MG TABS tablet Take 3 mg by mouth at bedtime.   Yes [provider]  Multiple Vitamins-Minerals (OCUTABS) TABS Take 1 tablet by mouth daily.   Yes [provider]  simvastatin  (ZOCOR ) 20 MG tablet Take 20 mg by mouth daily.   Yes [provider]  trolamine salicylate (ASPERCREME) 10 % cream Apply 1 application. topically 2 (two) times daily as needed for muscle pain.   Yes [provider]  metoprolol  tartrate (LOPRESSOR ) 25 MG tablet Take 0.5 tablets (12.5 mg total) by mouth 2 (two) times daily. Patient not taking: Reported on 02/25/2024 09/02/21   Patel, Sona, MD     Critical care time: 37 minutes    Lonell Moose, AGNP  Pulmonary/Critical Care Pager [phone] (please enter 7 digits) PCCM Consult Pager [phone] (please enter 7 digits)

## 2024-02-26 NOTE — Progress Notes (Signed)
 NEUROLOGY CONSULT FOLLOW UP NOTE   Date of service: February 26, 2024 Patient Name: ALIYA SOL MRN:  [id number] DOB:  [birth year]  Interval Hx/subjective   No significant changes Vitals   Vitals:   02/26/24 1230 02/26/24 1300 02/26/24 1330 02/26/24 1400  BP: (!) 165/76 (!) 139/53  (!) 156/54  Pulse:  (!) 57 61 72  Resp:  16 16 18   Temp:      TempSrc:      SpO2:  95% 93% 96%  Weight:      Height:         Body mass index is 21.37 kg/m.  Physical Exam   Constitutional: Appears well-developed and well-nourished.  Neurologic Examination    MS: She is awake, knows that she is in the hospital, but not which one.  She is not oriented to date CN: EOMI, VFF,?  Very mild right facial weakness Motor: Severe right arm > leg weakness   Medications  Current Facility-Administered Medications:    acetaminophen  (TYLENOL ) tablet 650 mg, 650 mg, Oral, Q4H PRN **OR** acetaminophen  (TYLENOL ) 160 MG/5ML solution 650 mg, 650 mg, Per Tube, Q4H PRN **OR** acetaminophen  (TYLENOL ) suppository 650 mg, 650 mg, Rectal, Q4H PRN, Shellia Mann D, NP   Chlorhexidine  Gluconate Cloth 2 % PADS 6 each, 6 each, Topical, Daily, Kasa, Kurian, MD, 6 each at 02/26/24 1056   clevidipine  (CLEVIPREX ) infusion 0.5 mg/mL, 0-21 mg/hr, Intravenous, Continuous, Ernest Ronal BRAVO, MD, Last Rate: 8 mL/hr at 02/26/24 1400, 4 mg/hr at 02/26/24 1400   Oral care mouth rinse, 15 mL, Mouth Rinse, PRN, Kasa, Kurian, MD   pantoprazole  (PROTONIX ) injection 40 mg, 40 mg, Intravenous, QHS, Keene, Jeremiah D, NP, 40 mg at 02/25/24 2108   senna-docusate (Senokot-S) tablet 1 tablet, 1 tablet, Oral, BID, Shellia Mann BIRCH, NP  Labs and Diagnostic Imaging   Imaging(Personally reviewed): MRI brain-stable intraparenchymal hematoma without obvious surrounding ischemia  Assessment   HALEIGH DESMITH is a 89 y.o. female with an intraparenchymal hematoma.  Etiology is unclear, possibilities include hemorrhagic conversion of the small  ischemic infarct that has been obscured by the hemorrhage, hemorrhagic mass that is been obscured by the hemorrhage, amyloid angiopathy though this is not supported by MRI, hypertensive bleed.  I suspect that she is likely going to have persistent deficits for the right arm and will need therapy and likely a higher level of support than she was previously getting.  Treatment consist primarily of blood pressure control at this point as well as therapy evaluations.  Recommendations  Continue to control BP with goal 130-150 Can resume prophylactic dose DVT prophylaxis tomorrow Pt, ot, st ______________________________________________________________________   Signed, Aisha Seals, MD [facility]

## 2024-02-26 NOTE — TOC CM/SW Note (Signed)
 Transition of Care [facility]) - Inpatient Brief Assessment   Patient Details  Name: Leslie Osborn MRN: [id number] Date of Birth: 02/28/1926  Transition [facility] CM/SW Contact:    Corrie JINNY Ruts, LCSW Phone Number: 02/26/2024, 4:13 PM   Clinical Narrative: Chart reviewed. The patient is admitted for Intracerebral Hemorrhage, intraparenchymal hemorrhage of brain, hypertension. Unable to speak with the patient due to dementia. I was able to speak the the patient sister/POA of the patient. The patient sister reports that the patient PCP is Lynwood Null. The patient sister confirms that the patient is a resident [address] The patient sister reports that the patient was able to complete daily living task independently before being admitted to the hospital.   The patient sister reports that she provided the patient with transportation to medical appointments and that she will assist the patient during discharge. The patient sister reports that the patient uses [facility] as a pharmacy and copays are affordable depending on the medication.   The patient sister reports that the patient has never has HH in the past or been admitted into a skilled nursing facility. The patient sister reports that the patient has been to to [facility] in the past. The patient sister reports that the patient has a walker. The patient sister reports that she is very concerned about the patient health. The patient sister reports that she wont know any disposition plan for the patient until she is able to see PT/OT and see medical progression. The patient sister expressed that she doesn't think [place] [facility] [place] has the staffing to meet the patient medical needs.   TOC will follow the patient until discharge.    Transition of Care Asessment: Insurance and Status: Insurance coverage has been reviewed Patient has primary care physician: Yes Home environment has been reviewed: yes Prior level of function:: Assisted  Living Prior/Current Home Services: No current home services Social Drivers of Health Review: SDOH reviewed no interventions necessary Readmission risk has been reviewed: Yes Transition of care needs: no transition of care needs at this time

## 2024-02-27 LAB — RENAL FUNCTION PANEL
Albumin: 3.8 g/dL (ref 3.5–5.0)
Anion gap: 13 (ref 5–15)
BUN: 31 mg/dL — ABNORMAL HIGH (ref 8–23)
CO2: 28 mmol/L (ref 22–32)
Calcium: 9.4 mg/dL (ref 8.9–10.3)
Chloride: 97 mmol/L — ABNORMAL LOW (ref 98–111)
Creatinine, Ser: 1.17 mg/dL — ABNORMAL HIGH (ref 0.44–1.00)
GFR, Estimated: 43 mL/min — ABNORMAL LOW (ref >60)
Glucose, Bld: 87 mg/dL (ref 70–99)
Phosphorus: 3 mg/dL (ref 2.5–4.6)
Potassium: 3.5 mmol/L (ref 3.5–5.1)
Sodium: 138 mmol/L (ref 135–145)

## 2024-02-27 LAB — CBC
HCT: 43.7 % (ref 36.0–46.0)
Hemoglobin: 14.3 g/dL (ref 12.0–15.0)
MCH: 31.8 pg (ref 26.0–34.0)
MCHC: 32.7 g/dL (ref 30.0–36.0)
MCV: 97.3 fL (ref 80.0–100.0)
Platelets: 160 K/uL (ref 150–400)
RBC: 4.49 MIL/uL (ref 3.87–5.11)
RDW: 14.1 % (ref 11.5–15.5)
WBC: 7.7 K/uL (ref 4.0–10.5)
nRBC: 0 % (ref 0.0–0.2)

## 2024-02-27 LAB — TRIGLYCERIDES: Triglycerides: 42 mg/dL (ref <150)

## 2024-02-27 LAB — T3, FREE: T3, Free: 1.6 pg/mL — ABNORMAL LOW (ref 2.0–4.4)

## 2024-02-27 LAB — MAGNESIUM: Magnesium: 1.8 mg/dL (ref 1.7–2.4)

## 2024-02-27 MED ORDER — MAGNESIUM SULFATE 2 GM/50ML IV SOLN
2.0000 g | Freq: Once | INTRAVENOUS | Status: AC
  Administered 2024-02-27: 2 g via INTRAVENOUS
  Filled 2024-02-27: qty 50

## 2024-02-27 MED ORDER — DIAZEPAM 5 MG/ML IJ SOLN
2.5000 mg | Freq: Once | INTRAMUSCULAR | Status: AC
  Administered 2024-02-27: 2.5 mg via INTRAVENOUS
  Filled 2024-02-27: qty 2

## 2024-02-27 NOTE — Evaluation (Signed)
 Occupational Therapy Evaluation Patient Details Name: Leslie Osborn MRN: [id number] DOB: Aug 06, 1925 Today's Date: 02/27/2024   History of Present Illness   89 y/o female presented to ED on 02/25/24 from [place] [facility] [place] for unwitnessed fall with acute R sided hemiparesis. MRI showed acute parenchymal hematoma in posterior left frontal lobe. PMH: Alzheimer's dementia, Afib, HTN, hx of breast and colon cancer, macular degeneration     Clinical Impressions Pt seen for OT evaluation and co-tx with PT to optimize safety. Patient admitted with the above. PTA, patient lives at [place] [facility] and was ambulatory with RW without assistance to/from dining hall. Oriented to self only and following one step commands inconsistently. Required maxA+2 for supine>sit and totalA to maintain sitting on EOB. Stood at bedside with totalA+2 and HHA with minimal initiation from patient. Returned to bed with totalA+2. Pt requires MAX A for all aspects of bathing and dressing. RUE noted with subluxation and pt unable to elicit muscle activation in RUE. Was able to move some with RLE. Patient will benefit from skilled OT services to maximize return to PLOF. Patient will benefit from ongoing therapy at discharge to maximize functional independence and safety.     If plan is discharge home, recommend the following:   Two people to help with walking and/or transfers;A lot of help with bathing/dressing/bathroom;Assistance with cooking/housework;Assist for transportation;Help with stairs or ramp for entrance;Direct supervision/assist for medications management;Direct supervision/assist for financial management;Supervision due to cognitive status     Functional Status Assessment   Patient has had a recent decline in their functional status and demonstrates the ability to make significant improvements in function in a reasonable and predictable amount of time.     Equipment Recommendations   Other  (comment) (defer)     Recommendations for Other Services         Precautions/Restrictions   Precautions Precautions: Fall Recall of Precautions/Restrictions: Impaired Restrictions Weight Bearing Restrictions Per Provider Order: No     Mobility Bed Mobility Overal bed mobility: Needs Assistance Bed Mobility: Supine to Sit, Sit to Supine, Rolling Rolling: Max assist   Supine to sit: Max assist, +2 for physical assistance, +2 for safety/equipment Sit to supine: Total assist, +2 for safety/equipment, +2 for physical assistance        Transfers Overall transfer level: Needs assistance Equipment used: 2 person hand held assist Transfers: Sit to/from Stand Sit to Stand: Total assist, +2 physical assistance, +2 safety/equipment          Balance Overall balance assessment: Needs assistance Sitting-balance support: No upper extremity supported, Feet supported Sitting balance-Leahy Scale: Zero Sitting balance - Comments: totalA to maintain sitting     Standing balance-Leahy Scale: Zero         ADL either performed or assessed with clinical judgement   ADL Overall ADL's : Needs assistance/impaired     Grooming: Bed level;Set up Grooming Details (indicate cue type and reason): pt  unable to incorporate RUE into any tasks, able to use L hand and set up for tasks with supported long sitting in bed             Lower Body Dressing: Bed level;Maximal assistance Lower Body Dressing Details (indicate cue type and reason): MAX  A for donning L sock, pt unable to lift leg at all to assist wiht donning R sock                      Pertinent Vitals/Pain Pain Assessment  Pain Assessment: Faces Faces Pain Scale: Hurts even more Pain Location: R UE Pain Descriptors / Indicators: Grimacing, Guarding Pain Intervention(s): Limited activity within patient's tolerance, Monitored during session, Repositioned     Extremity/Trunk Assessment Upper Extremity  Assessment Upper Extremity Assessment: Generalized weakness;Right hand dominant;RUE deficits/detail RUE Deficits / Details: >1 shoulder subluxation noted, zero muscle activation noted in RUE, question light touch deficits RUE: Subluxation noted RUE Sensation: decreased light touch RUE Coordination: decreased gross motor;decreased fine motor   Lower Extremity Assessment Lower Extremity Assessment: Defer to PT evaluation;RLE deficits/detail RLE Deficits / Details: grossly 1/5 on command; on observation 2-/5 RLE Sensation: decreased proprioception RLE Coordination: decreased fine motor;decreased gross motor   Cervical / Trunk Assessment Cervical / Trunk Assessment: Kyphotic   Communication Communication Communication: No apparent difficulties   Cognition Arousal: Alert Behavior During Therapy: WFL for tasks assessed/performed Cognition: No family/caregiver present to determine baseline             OT - Cognition Comments: Questionable awareness of deficits and attention to R side at times.       Following commands: Impaired Following commands impaired: Follows one step commands inconsistently, Follows one step commands with increased time     Cueing  General Comments   Cueing Techniques: Verbal cues;Gestural cues;Tactile cues;Visual cues              Home Living Family/patient expects to be discharged to:: Other (Comment)       Additional Comments: Memory Care at [facility] [place]      Prior Functioning/Environment Prior Level of Function : Needs assist             Mobility Comments: Ambulatory with Rw around facility ADLs Comments: Assist for ADLs but able to get to/from bathroom and perform pericare independently    OT Problem List: Decreased strength;Decreased coordination;Pain;Impaired sensation;Decreased cognition;Decreased range of motion;Decreased activity tolerance;Decreased safety awareness;Decreased knowledge of use of DME or AE;Impaired  balance (sitting and/or standing);Decreased knowledge of precautions;Impaired UE functional use   OT Treatment/Interventions: Self-care/ADL training;Therapeutic exercise;Therapeutic activities;Cognitive remediation/compensation;Neuromuscular education;Energy conservation;Patient/family education;DME and/or AE instruction;Manual therapy;Balance training      OT Goals(Current goals can be found in the care plan section)   Acute Rehab OT Goals Patient Stated Goal: get better and go home OT Goal Formulation: With patient Time For Goal Achievement: 03/12/24 Potential to Achieve Goals: Fair ADL Goals Pt Will Perform Grooming: sitting;with contact guard assist;with set-up (without LOB, 5+min) Additional ADL Goal #1: Pt will attend to RUE for joint protection/repositioning with VC as needed, 3/3 opportunities. Additional ADL Goal #2: Pt will complete AAROM BUE exercises with handout and visual/verbal cues, 5/5 opportunities.   OT Frequency:  Min 3X/week    Co-evaluation PT/OT/SLP Co-Evaluation/Treatment: Yes Reason for Co-Treatment: Necessary to address cognition/behavior during functional activity;Complexity of the patient's impairments (multi-system involvement);For patient/therapist safety PT goals addressed during session: Mobility/safety with mobility;Balance OT goals addressed during session: ADL's and self-care      AM-PAC OT 6 Clicks Daily Activity     Outcome Measure Help from another person eating meals?: A Little Help from another person taking care of personal grooming?: A Little Help from another person toileting, which includes using toliet, bedpan, or urinal?: Total Help from another person bathing (including washing, rinsing, drying)?: A Lot Help from another person to put on and taking off regular upper body clothing?: A Lot Help from another person to put on and taking off regular lower body clothing?: A Lot 6 Click Score: 13  End of Session Nurse Communication:  Mobility status  Activity Tolerance: Patient tolerated treatment well Patient left: in bed;with call bell/phone within reach;with bed alarm set;with nursing/sitter in room  OT Visit Diagnosis: Other abnormalities of gait and mobility (R26.89);Hemiplegia and hemiparesis;Pain Hemiplegia - Right/Left: Right Hemiplegia - dominant/non-dominant: Dominant Hemiplegia - caused by: Nontraumatic intracerebral hemorrhage Pain - Right/Left: Right Pain - part of body: Shoulder                Time: 9076-9059 OT Time Calculation (min): 17 min Charges:  OT General Charges $OT Visit: 1 Visit OT Evaluation $OT Eval High Complexity: 1 High  Warren SAUNDERS., MPH, MS, OTR/L ascom [phone] 02/27/24, 2:31 PM

## 2024-02-27 NOTE — Progress Notes (Signed)
 NAME:  Leslie Osborn, MRN:  [id number], DOB:  [birth year], LOS: 2 ADMISSION DATE:  02/25/2024, CONSULTATION DATE:  02/25/2024 REFERRING MD:  Dr. Ernest, CHIEF COMPLAINT:  Fall, right sided weakness   Brief Pt Description / Synopsis:  89 y.o. female with PMHx significant for Atrial Fibrillation not on anticoagulation due to fall risk and Alzheimer's, presenting status post fall and acute onset right sided hemiparesis.  Found to have small Intracerebral Hemorrhage requiring Cleviprex  infusion.  History of Present Illness:  Leslie Osborn is a 89 y.o. female with PMHx significant for Atrial Fibrillation not on anticoagulation due to fall risk and dementia, presenting to [facility] ED on 02/25/24 from [place] [facility] [place] [facility] status post unwitnessed fall and acute onset of right sided hemiparesis.  Her last known well time was approximately 14:00 yesterday.  In the ED there was concern for possible stroke which was discussed with Neurology.  She was outside the window for TNK and given her age and dementia history, she was deemed not to be a potential candidate for thrombectomy.  ED Course: Initial Vital Signs: Temperature 97.5 F, pulse 50, RR 16, BP 194/84, SpO2 96% on room air Significant Labs: platelets 120, glucose 100 Imaging Chest X-ray>>IMPRESSION: No active disease. Aortic Atherosclerosis X-ray Pelvis>>FINDINGS: There is no evidence of pelvic fracture or diastasis. No pelvic bone lesions are seen. CT Cervical Spine>>IMPRESSION: Degenerative change in the cervical spine without acute fracture or subluxation. CT angio Head & Neck>>IMPRESSION: 1. Acute parenchymal hematoma in the posterior left frontal lobe with mild surrounding edema and local mass effect. No midline shift. 2. No large vessel occlusion. 3. Calcified atherosclerosis at the left carotid bifurcation resulting in approximately 60% stenosis at the origin of the left cervical ICA. Medications Administered: Cleviprex  infusion  initiated   PCCM asked to admit for further workup and treatment.  Neurology consulted, ok to admit to [facility] ICU with strict BP control.   Please see Significant Hospital Events section below for full detailed hospital course.   Pertinent  Medical History   Past Medical History:  Diagnosis Date   Alzheimer disease [facility])    Bladder cancer (HCC) 2009   Cancer of breast (HCC) 2011   R-Breast DCIS, ER/PR negative   Carcinoma in situ of breast 2011   R-breast   Cataract extraction status of left eye    Cataract extraction status of right eye    Cervical cancer (HCC) ?   Colon cancer (HCC) 1992   chemo   Family history of adverse reaction to anesthesia    sister got nauseated after surgery many years ago.   Hypertension 1991   Hypothyroidism    Macular degeneration 2012   Mammographic microcalcification    Restless legs syndrome     Micro Data:  9/2: MRSA PCR>>negative  Antimicrobials:   Anti-infectives (From admission, onward)    None       Significant Hospital Events: Including procedures, antibiotic start and stop dates in addition to other pertinent events   9/2: Presented to ED following fall and right sided weakness.  Found to have small ICH, evaluated by Neurology, ok to admit to [facility] ICU with strict BP control utilizing Cleviprex .  9/3: Pt remains on Cleviprex  gtt @2mg /hr to maintain sbp 130-150.   9/4: Pt no longer requiring Cleviprex  gtt to maintain sbp 130-150.  Pending transfer to the telemetry unit.  Will transfer service to [facility] to assume care on 09/05  Interim History / Subjective:  As outlined above under Significant  Hospital Events section  Objective   Blood pressure 139/82, pulse 64, temperature 97.7 F (36.5 C), temperature source Axillary, resp. rate 18, height 5' 4 (1.626 m), weight 56.5 kg, SpO2 95%.        Intake/Output Summary (Last 24 hours) at 02/27/2024 0804 Last data filed at 02/27/2024 0001 Gross per 24 hour  Intake 80.48 ml  Output  250 ml  Net -169.52 ml   Filed Weights   02/25/24 1015  Weight: 56.5 kg    Examination: General: Acute on chronically ill appearing elderly female, NAD on RA HENT: Atraumatic, normocephalic, neck supple, no JVD, PERRLA Lungs: Clear throughout, even, non labored  Cardiovascular: Irregularly irregular rhythm, bradycardic, no m/r/g, 2+ radial/1+ distal pulses, no edema  Abdomen: +BS x4, obese, soft, non distended, non tender  Extremities: Normal bulk and tone, no deformities, moves all extremities  Neuro: Sleepy, follows commands, right hemiparesis, PERRLA  GU: External catheter draining yellow   Resolved Hospital Problem list    Assessment & Plan:   #Acute intracerebral hemorrhage  #Acute metabolic encephalopathy #Dementia at baseline CTA Head Neck 9/2: Acute parenchymal hematoma in the posterior left frontal lobe measuring 2.1 x 2.3 x 3.0 cm, mild surrounding edema and local mass effect, but no midline shift MRI Brain 09/03: Acute parenchymal hematoma in the posterior left frontal lobe involving the left precentral gyrus. Mild surrounding edema and local mass effect. No midline shift. Punctate foci of susceptibility within the pons, possibly related to hypertension. Chronic microangiopathic ischemic changes. Mild parenchymal volume loss. - Treat metabolic derangements  - Provide supportive care - Promote normal sleep/wake cycle and family presence - Sitter at bedside for safety  - Avoid sedating medications as able - Neuro checks per NIH stroke scale  - Strict BP control: maintain SBP 130-150 - Neurology following, appreciate input - Obtain Stat CT Head for acute neurological changes  - No antiplatelets or anticoagulation  - SCD's for DVT prophylaxis - PT/OT   #HTN #Atrial Fibrillation  HTN: A.fib not on anticoagulation due to fall risk - Continuous telemetry monitoring - Continue scheduled amlodipine , hydrochlorothiazide , and simvastatin    - Prn hydralazine  for sbp >150   - HS Troponin negative x2 - Per neurology ok to start DVT prophylaxis 09/04  #Thrombocytopenia~resolved   - Trend CBC - Monitor for s/sx of bleeding  - SCD's for VTE prophylaxis  - Transfuse for hgb less than 7  #Endo  - Follow hypo/hyperglycemic protocol  - Target CBG readings 140 to 180  Pt is critically ill with multiorgan failure. Prognosis is guarded, high risk for further decompensation, cardiac arrest, and death.  Given current critical illness superimposed on multiple chronic co-morbidities and advanced age, overall long term prognosis is poor.  Pt is  DNR/DNI status as confirmed by Most Form at bedside along with confirmation by pt's sister at bedside.  Depending on clinical course, may need to consult Palliative Care to assist with GOC discussions.  Best Practice (right click and Reselect all SmartList Selections daily)   Diet/type: DYS Diet with Thin Liquids  DVT prophylaxis: SCD GI prophylaxis: PPI Lines: N/A Foley:  N/A Code Status:  DNR Last date of multidisciplinary goals of care discussion [02/27/2024]  09/4: Updated pts sister Roderick Leech via telephone regarding pts condition and current plan of care.  Informed her pt will be transferred out of ICU once a bed is available on the telemetry unit.  All questions were answered.  Ms Leech was appreciative to receive an update Labs  CBC: Recent Labs  Lab 02/25/24 1025 02/26/24 0521 02/27/24 0209  WBC 5.7 7.3 7.7  NEUTROABS 4.5  --   --   HGB 13.6 13.6 14.3  HCT 42.4 40.1 43.7  MCV 99.1 95.5 97.3  PLT 120* 160 160    Basic Metabolic Panel: Recent Labs  Lab 02/25/24 1025 02/26/24 0521 02/27/24 0209  NA 138 139 138  K 3.8 3.9 3.5  CL 99 100 97*  CO2 26 28 28   GLUCOSE 100* 90 87  BUN 28* 28* 31*  CREATININE 0.95 1.01* 1.17*  CALCIUM 9.2 9.3 9.4  MG  --  1.9 1.8  PHOS  --  3.5 3.0   GFR: Estimated Creatinine Clearance: 24.3 mL/min (A) (by C-G formula based on SCr of 1.17 mg/dL (H)). Recent Labs   Lab 02/25/24 1025 02/26/24 0521 02/27/24 0209  WBC 5.7 7.3 7.7    Liver Function Tests: Recent Labs  Lab 02/25/24 1025 02/26/24 0521 02/27/24 0209  AST 29  --   --   ALT 11  --   --   ALKPHOS 53  --   --   BILITOT 0.8  --   --   PROT 6.7  --   --   ALBUMIN 3.7 3.6 3.8   No results for input(s): LIPASE, AMYLASE in the last 168 hours. No results for input(s): AMMONIA in the last 168 hours.  ABG No results found for: PHART, PCO2ART, PO2ART, HCO3, TCO2, ACIDBASEDEF, O2SAT   Coagulation Profile: Recent Labs  Lab 02/25/24 1025  INR 1.1    Cardiac Enzymes: Recent Labs  Lab 02/25/24 1025  CKTOTAL 57    HbA1C: Hgb A1c MFr Bld  Date/Time Value Ref Range Status  09/01/2021 03:54 AM 5.8 (H) 4.8 - 5.6 % Final    Comment:    (NOTE)         Prediabetes: 5.7 - 6.4         Diabetes: >6.4         Glycemic control for adults with diabetes: <7.0     CBG: Recent Labs  Lab 02/25/24 1620  GLUCAP 96    Review of Systems:   Unable to assess due to AMS  Past Medical History:  She,  has a past medical history of Alzheimer disease (HCC), Bladder cancer (HCC) (2009), Cancer of breast (HCC) (2011), Carcinoma in situ of breast (2011), Cataract extraction status of left eye, Cataract extraction status of right eye, Cervical cancer (HCC) (?), Colon cancer (HCC) (1992), Family history of adverse reaction to anesthesia, Hypertension (1991), Hypothyroidism, Macular degeneration (2012), Mammographic microcalcification, and Restless legs syndrome.   Surgical History:   Past Surgical History:  Procedure Laterality Date   ABDOMINAL HYSTERECTOMY  1972   APPENDECTOMY     BLADDER SURGERY  2009   BREAST BIOPSY  1960   BREAST BIOPSY Right 2011   positive- radiation   BREAST CYST EXCISION Right    BREAST SURGERY Right 11/15/09   lumpectomy   COLON SURGERY  1992   resection, chemo   COLONOSCOPY  2007   CYSTOSCOPY  2010   EYE SURGERY  2000, 2010   cataract    mammosite balloon placement  2011   Removed in 2011   POLYPECTOMY  2003   TRANSURETHRAL RESECTION OF BLADDER TUMOR N/A 01/31/2016   Procedure: TRANSURETHRAL RESECTION OF BLADDER TUMOR (TURBT);  Surgeon: Ozell JONELLE Burkes, MD;  Location: [facility] ORS;  Service: Urology;  Laterality: N/A;     Social History:   reports  that she quit smoking about 18 years ago. Her smoking use included cigarettes. She has never used smokeless tobacco. She reports that she does not drink alcohol and does not use drugs.   Family History:  Her family history is not on file.   Allergies Allergies  Allergen Reactions   Donepezil Hcl Nausea And Vomiting and Diarrhea     Home Medications  Prior to Admission medications   Medication Sig Start Date End Date Taking? Authorizing Provider  acetaminophen  (TYLENOL ) 500 MG tablet Take 1,000 mg by mouth every 6 (six) hours as needed for mild pain (pain score 1-3).   Yes [provider]  amLODipine  (NORVASC ) 2.5 MG tablet Take 2.5 mg by mouth daily.   Yes [provider]  escitalopram  (LEXAPRO ) 5 MG tablet Take 5 mg by mouth daily.   Yes [provider]  galantamine  (RAZADYNE ) 12 MG tablet Take 12 mg by mouth 2 (two) times daily.   Yes [provider]  hydrochlorothiazide  (HYDRODIURIL ) 12.5 MG tablet Take 12.5 mg by mouth daily. 07/07/21  Yes [provider]  levothyroxine  (SYNTHROID ) 75 MCG tablet Take 1 tablet (75 mcg total) by mouth daily before breakfast. 09/03/21  Yes Patel, Sona, MD  lisinopril  (ZESTRIL ) 40 MG tablet Take 40 mg by mouth in the morning and at bedtime.   Yes [provider]  melatonin 3 MG TABS tablet Take 3 mg by mouth at bedtime.   Yes [provider]  Multiple Vitamins-Minerals (OCUTABS) TABS Take 1 tablet by mouth daily.   Yes [provider]  simvastatin  (ZOCOR ) 20 MG tablet Take 20 mg by mouth daily.   Yes [provider]  trolamine salicylate (ASPERCREME) 10 % cream Apply 1  application. topically 2 (two) times daily as needed for muscle pain.   Yes [provider]  metoprolol  tartrate (LOPRESSOR ) 25 MG tablet Take 0.5 tablets (12.5 mg total) by mouth 2 (two) times daily. Patient not taking: Reported on 02/25/2024 09/02/21   Patel, Sona, MD     Critical care time: 35 minutes    Lonell Moose, [facility]  Pulmonary/Critical Care Pager [phone] (please enter 7 digits) PCCM Consult Pager [phone] (please enter 7 digits)

## 2024-02-27 NOTE — Progress Notes (Signed)
 Speech Language Pathology Treatment: Dysphagia  Patient Details Name: Leslie Osborn MRN: [id number] DOB: 09-Jul-1925 Today's Date: 02/27/2024 Time: 8784-8744 SLP Time Calculation (min) (ACUTE ONLY): 40 min  Assessment / Plan / Recommendation Clinical Impression  Pt seen for ongoing assessment of swallowing and toleration of diet; trials to upgrade diet consistency to a Dysphagia level 2(minced foods) if able. Pt resting in bed; awake. She responded to social verbal engagement; she required Full feeding support. Noted Dentures. Pt has Baseline dx of Dementia. Encouragement required initially for pt to participate in po intake/trials per NT/Sitter w/ pt.  On RA; afebrile. WBC wnl.   Pt appears to present w/ functional oropharyngeal phase swallowing w/ trial consistencies given, in setting of declined Cognitive status; Baseline Dementia. Pt resides at a [facility]- unsure of pt's oral management and intake of solid foods there. ANY Cognitive decline can impact overall awareness/engagement w/ oral intake as well as timing of swallow and safety during oral intake which increases risk for dysphagia. Pt's risk for aspiration can be reduced when following general aspiration precautions, given full feeding assistance/support, and using a modified diet consistency of Minced and Pureed foods, thin liquids. Encouragement w/ oral intake is indicated also.      Pt consumed several trials of purees, minced foods moistened well, and thin liquids via Straw w/ No overt clinical s/s of aspiration noted: no decline in vocal quality, no cough, and no decline in respiratory status during/post trials. O2 sats remained upper 90s during. Oral phase was functional for bolus management, mastication of the Minced foods, and oral clearing of the boluses given. She did not attempt self-feeding.  Pt was wearing full Dentures- NT removed and cleaned post meal.   In setting of baseline Dementia and Cognitive decline w/  acute hospitalization, recommend a dysphagia level 2(Minced and puree foods) moistened for ease of oral phase; thin liquids. Recommend general aspiration precautions; reduce Distractions during meals and engage pt in self-feeding. Pills Crushed in Puree for safer swallowing as needed. Tray setup and Support w/ feeding at meals. Small bites/sips Slowly. MD/NSG updated.  ST services recommends follow w/ Palliative Care for GOC and education w/ Family re: impact of Cognitive decline/Dementia on swallowing and oral intake in general. Suspect pt is close to/at her baseline. ST services will f/u w/ toleration of diet. Precautions posted in room, chart. Pt to have f/u at next venue of care as indicated.           HPI HPI: Pt is a 89 y.o. female with PMHx significant for Alzheimer's Dementia, Atrial Fibrillation not on anticoagulation due to fall risk and dementia, HTN, Macular Degeneration, presenting to [facility] ED on 02/25/24 from [place] [facility] [place] [facility] status post unwitnessed fall and acute onset of right sided hemiparesis.  Per ED note, there was concern for possible stroke which was discussed with Neurology.  She was outside the window for TNK and given her age and Dementia history, she was deemed not to be a potential candidate for thrombectomy.   MRI: Acute parenchymal hematoma in the posterior left frontal lobe involving the  left precentral gyrus. Mild surrounding edema and local mass effect. No midline  shift.  2. Punctate foci of susceptibility within the pons, possibly related to  hypertension.  3. Chronic microangiopathic ischemic changes.  4. Mild parenchymal volume loss.   CXR: No active disease.      SLP Plan  Continue with current plan of care  Recommendations  Diet recommendations: Dysphagia 2 (fine chop);Thin liquid (added purees) Liquids provided via: Cup;Straw (monitor) Medication Administration: Crushed with puree Supervision: Full supervision/cueing for  compensatory strategies;Staff to assist with self feeding Compensations: Minimize environmental distractions;Small sips/bites;Slow rate;Lingual sweep for clearance of pocketing;Multiple dry swallows after each bite/sip;Follow solids with liquid Postural Changes and/or Swallow Maneuvers: Out of bed for meals;Seated upright 90 degrees;Upright 30-60 min after meal                 (Dietician; Palliative Care) Oral care BID;Oral care before and after PO;Staff/trained caregiver to provide oral care (denture care)   Frequent or constant Supervision/Assistance Dysphagia, oral phase (R13.11) (impact from Alzheimer's Dementia; not feeding self)     Continue with current plan of care       Leslie Portugal, MS, CCC-SLP Speech Language Pathologist Rehab Services; [facility] [place] [phone] (ascom) Leslie Osborn  02/27/2024, 4:58 PM

## 2024-02-27 NOTE — Evaluation (Signed)
 Physical Therapy Evaluation Patient Details Name: Leslie Osborn MRN: [id number] DOB: 05/01/1926 Today's Date: 02/27/2024  History of Present Illness  89 y/o female presented to ED on 02/25/24 from [place] [facility] [place] for unwitnessed fall with acute R sided hemiparesis. MRI showed acute parenchymal hematoma in posterior left frontal lobe. PMH: Alzheimer's dementia, Afib, HTN, hx of breast and colon cancer, macular degeneration  Clinical Impression  Patient admitted with the above. PTA, patient lives at [facility] and was ambulatory with RW without assistance to/from dining hall. Oriented to self only and following one step commands inconsistently. Required maxA+2 for supine>sit and totalA to maintain sitting on EOB. Stood at bedside with totalA+2 and HHA with minimal initiation from patient. Returned to bed with totalA+2. Patient will benefit from skilled PT services during acute stay to address listed deficits. Patient will benefit from ongoing therapy at discharge to maximize functional independence and safety.         If plan is discharge home, recommend the following: Two people to help with walking and/or transfers;Two people to help with bathing/dressing/bathroom;Assistance with cooking/housework;Assistance with feeding;Direct supervision/assist for medications management;Direct supervision/assist for financial management;Assist for transportation;Supervision due to cognitive status   Can travel by private vehicle   No    Equipment Recommendations Other (comment) (TBD)  Functional Status Assessment Patient has had a recent decline in their functional status and/or demonstrates limited ability to make significant improvements in function in a reasonable and predictable amount of time     Precautions / Restrictions Precautions Precautions: Fall Recall of Precautions/Restrictions: Impaired Restrictions Weight Bearing Restrictions Per Provider Order: No       Mobility  Bed Mobility Overal bed mobility: Needs Assistance Bed Mobility: Supine to Sit, Sit to Supine     Supine to sit: Max assist, +2 for physical assistance, +2 for safety/equipment Sit to supine: Total assist, +2 for safety/equipment, +2 for physical assistance        Transfers Overall transfer level: Needs assistance Equipment used: 2 person hand held assist Transfers: Sit to/from Stand Sit to Stand: Total assist, +2 physical assistance, +2 safety/equipment                Ambulation/Gait                  Stairs            Wheelchair Mobility     Tilt Bed    Modified Rankin (Stroke Patients Only)       Balance Overall balance assessment: Needs assistance Sitting-balance support: No upper extremity supported, Feet supported Sitting balance-Leahy Scale: Zero Sitting balance - Comments: totalA to maintain sitting                                     Pertinent Vitals/Pain Pain Assessment Pain Assessment: Faces Faces Pain Scale: Hurts even more Pain Location: R UE Pain Descriptors / Indicators: Grimacing, Guarding Pain Intervention(s): Limited activity within patient's tolerance, Monitored during session, Repositioned    Home Living Family/patient expects to be discharged to:: Other (Comment)                   Additional Comments: Memory Care at [facility] [place]    Prior Function Prior Level of Function : Needs assist             Mobility Comments: Ambulatory with Rw around facility ADLs Comments: Assist  for ADLs but able to get to/from bathroom and perform pericare independently     Extremity/Trunk Assessment   Upper Extremity Assessment Upper Extremity Assessment: Defer to OT evaluation    Lower Extremity Assessment Lower Extremity Assessment: RLE deficits/detail RLE Deficits / Details: grossly 1/5 on command; on observation 2-/5 RLE Sensation: decreased proprioception RLE Coordination:  decreased fine motor;decreased gross motor    Cervical / Trunk Assessment Cervical / Trunk Assessment: Kyphotic  Communication   Communication Communication: No apparent difficulties    Cognition Arousal: Alert Behavior During Therapy: WFL for tasks assessed/performed   PT - Cognitive impairments: History of cognitive impairments                       PT - Cognition Comments: Oriented to self, follows commands inconsistently Following commands: Impaired Following commands impaired: Follows one step commands inconsistently, Follows one step commands with increased time     Cueing Cueing Techniques: Verbal cues, Gestural cues, Tactile cues, Visual cues     General Comments      Exercises     Assessment/Plan    PT Assessment Patient needs continued PT services  PT Problem List Decreased strength;Decreased activity tolerance;Decreased balance;Decreased mobility;Decreased knowledge of use of DME;Decreased safety awareness;Decreased knowledge of precautions;Cardiopulmonary status limiting activity;Decreased cognition;Decreased coordination;Pain       PT Treatment Interventions DME instruction;Functional mobility training;Therapeutic activities;Therapeutic exercise;Balance training;Neuromuscular re-education;Patient/family education    PT Goals (Current goals can be found in the Care Plan section)  Acute Rehab PT Goals Patient Stated Goal: did not state PT Goal Formulation: With family Time For Goal Achievement: 03/12/24 Potential to Achieve Goals: Poor    Frequency Min 3X/week     Co-evaluation PT/OT/SLP Co-Evaluation/Treatment: Yes Reason for Co-Treatment: Necessary to address cognition/behavior during functional activity;Complexity of the patient's impairments (multi-system involvement);For patient/therapist safety PT goals addressed during session: Mobility/safety with mobility;Balance         AM-PAC PT 6 Clicks Mobility  Outcome Measure Help needed  turning from your back to your side while in a flat bed without using bedrails?: Total Help needed moving from lying on your back to sitting on the side of a flat bed without using bedrails?: Total Help needed moving to and from a bed to a chair (including a wheelchair)?: Total Help needed standing up from a chair using your arms (e.g., wheelchair or bedside chair)?: Total Help needed to walk in hospital room?: Total Help needed climbing 3-5 steps with a railing? : Total 6 Click Score: 6    End of Session   Activity Tolerance: Patient tolerated treatment well Patient left: in bed;with call bell/phone within reach;with bed alarm set;with nursing/sitter in room Nurse Communication: Mobility status PT Visit Diagnosis: Muscle weakness (generalized) (M62.81);Other abnormalities of gait and mobility (R26.89);Other symptoms and signs involving the nervous system (R29.898)    Time: 9076-9059 PT Time Calculation (min) (ACUTE ONLY): 17 min   Charges:   PT Evaluation $PT Eval Moderate Complexity: 1 Mod   PT General Charges $$ ACUTE PT VISIT: 1 Visit         Maryanne Finder, PT, DPT Physical Therapist - [facility]  [facility]  Albina Gosney A [place] 02/27/2024, 1:07 PM

## 2024-02-27 NOTE — Progress Notes (Signed)
 Patient being transferred to 1C bed 108. Called report to [place], Charity fundraiser. Patient transported to 1C via bed.

## 2024-02-28 DIAGNOSIS — S0636AA Traumatic hemorrhage of cerebrum, unspecified, with loss of consciousness status unknown, initial encounter: Secondary | ICD-10-CM | POA: Diagnosis not present

## 2024-02-28 LAB — CBC
HCT: 43.9 % (ref 36.0–46.0)
Hemoglobin: 14.8 g/dL (ref 12.0–15.0)
MCH: 32.2 pg (ref 26.0–34.0)
MCHC: 33.7 g/dL (ref 30.0–36.0)
MCV: 95.4 fL (ref 80.0–100.0)
Platelets: 146 K/uL — ABNORMAL LOW (ref 150–400)
RBC: 4.6 MIL/uL (ref 3.87–5.11)
RDW: 14.1 % (ref 11.5–15.5)
WBC: 6.7 K/uL (ref 4.0–10.5)
nRBC: 0 % (ref 0.0–0.2)

## 2024-02-28 LAB — RENAL FUNCTION PANEL
Albumin: 3.7 g/dL (ref 3.5–5.0)
Anion gap: 11 (ref 5–15)
BUN: 36 mg/dL — ABNORMAL HIGH (ref 8–23)
CO2: 27 mmol/L (ref 22–32)
Calcium: 9.1 mg/dL (ref 8.9–10.3)
Chloride: 97 mmol/L — ABNORMAL LOW (ref 98–111)
Creatinine, Ser: 1.17 mg/dL — ABNORMAL HIGH (ref 0.44–1.00)
GFR, Estimated: 43 mL/min — ABNORMAL LOW (ref >60)
Glucose, Bld: 86 mg/dL (ref 70–99)
Phosphorus: 3.4 mg/dL (ref 2.5–4.6)
Potassium: 3.5 mmol/L (ref 3.5–5.1)
Sodium: 135 mmol/L (ref 135–145)

## 2024-02-28 LAB — MAGNESIUM: Magnesium: 2.7 mg/dL — ABNORMAL HIGH (ref 1.7–2.4)

## 2024-02-28 MED ORDER — ENOXAPARIN SODIUM 30 MG/0.3ML IJ SOSY
30.0000 mg | PREFILLED_SYRINGE | INTRAMUSCULAR | Status: DC
  Administered 2024-02-28 – 2024-03-02 (×4): 30 mg via SUBCUTANEOUS
  Filled 2024-02-28 (×4): qty 0.3

## 2024-02-28 MED ORDER — PANTOPRAZOLE SODIUM 40 MG PO TBEC
40.0000 mg | DELAYED_RELEASE_TABLET | Freq: Every day | ORAL | Status: DC
  Administered 2024-02-28 – 2024-03-01 (×3): 40 mg via ORAL
  Filled 2024-02-28 (×3): qty 1

## 2024-02-28 NOTE — Plan of Care (Signed)

## 2024-02-28 NOTE — TOC PASRR Note (Signed)
 The above named patient is recommended to go to Short Term Rehab for strengthening and gait training for balance.  It is expected that the Short Term Rehab stay will be less than 30 days.  The patient is expected to return home after Rehab.

## 2024-02-28 NOTE — Progress Notes (Signed)
 Speech Language Pathology Treatment: Dysphagia  Patient Details Name: Leslie Osborn MRN: [id number] DOB: 1926-02-23 Today's Date: 02/28/2024 Time: 0830-0905 SLP Time Calculation (min) (ACUTE ONLY): 35 min  Assessment / Plan / Recommendation Clinical Impression  Pt seen for ongoing assessment of swallowing and toleration of upgrade of diet to a dysphagia level 2(MINCED foods). Pt finishing her breakfast meal; resting in bed intermittently. She was awake w/ eyes open but only responded intermittently to social verbal engagement. She required Full feeding support and Supervision. Pt wears full Dentures. Pt has Baseline dx of Dementia. Sitter present w/ pt.  On RA; afebrile. WBC wnl.   Pt appears to present w/ functional oropharyngeal phase swallowing w/ trial consistencies given, in setting of declined Cognitive status; Baseline Dementia. Pt resides at a [facility]- unsure of pt's oral management and intake of solid foods there. ANY Cognitive decline can impact overall awareness/engagement w/ oral intake as well as timing of swallow and safety during oral intake which increases risk for dysphagia. Pt's risk for aspiration can be reduced when following general aspiration precautions, given full feeding assistance/support, and using a modified diet consistency of Minced and Pureed foods, thin liquids. Encouragement w/ oral intake is indicated also.      Pt consumed several trials of purees, minced foods moistened well, and thin liquids via Straw w/ No overt clinical s/s of aspiration noted: no decline in vocal quality, no cough, and no decline in respiratory status during/post trials. Oral phase was functional for bolus management, mastication of most Minced foods, and oral clearing of the boluses given. Noted prolonged oral phase time/mastication w/ the certain foods of increased texture- boiled/baked potato pieces(not as minced as other foods). Time required for complete oral clearing. Pt could  help hold cup to drink- this increases awareness and safety.    In setting of baseline Dementia and Cognitive decline w/ acute hospitalization, recommend continue a dysphagia level 2(Minced and puree foods) moistened for ease of oral phase; MONITOR for those foods that are not well-minced and substitute w/ another foods. Thin liquids- monitor cup/straw drinking and pt to help hold cup to drink for safety. Recommend general aspiration precautions; reduce Distractions during meals and engage pt in self-feeding. Pills Crushed in Puree for safer swallowing as needed. Tray setup and Support w/ feeding at meals. Small bites/sips Slowly. MD/NSG updated.  ST services recommends follow w/ Palliative Care for GOC and education w/ Family re: impact of Cognitive decline/Dementia on swallowing and oral intake in general. Suspect pt is close to/at her baseline. MD to reconsult ST services if needs indicate while still admitted. Precautions posted in room, chart. Pt to have f/u at next venue of care as indicated. NSG updated.       HPI HPI: Pt is a 89 y.o. female with PMHx significant for Alzheimer's Dementia, Atrial Fibrillation not on anticoagulation due to fall risk and dementia, HTN, Macular Degeneration, presenting to [facility] ED on 02/25/24 from [place] [facility] status post unwitnessed fall and acute onset of right sided hemiparesis.  Per ED note, there was concern for possible stroke which was discussed with Neurology.  She was outside the window for TNK and given her age and Dementia history, she was deemed not to be a potential candidate for thrombectomy.   MRI: Acute parenchymal hematoma in the posterior left frontal lobe involving the  left precentral gyrus. Mild surrounding edema and local mass effect. No midline  shift.  2. Punctate foci of susceptibility within  the pons, possibly related to  hypertension.  3. Chronic microangiopathic ischemic changes.  4. Mild parenchymal volume loss.   CXR: No  active disease.      SLP Plan  All goals met          Recommendations  Diet recommendations: Dysphagia 2 (fine chop);Thin liquid (added purees for ease of intake) Liquids provided via: Cup;Straw (monitor) Medication Administration: Crushed with puree Supervision: Full supervision/cueing for compensatory strategies;Staff to assist with self feeding Compensations: Minimize environmental distractions;Small sips/bites;Slow rate;Lingual sweep for clearance of pocketing;Multiple dry swallows after each bite/sip;Follow solids with liquid Postural Changes and/or Swallow Maneuvers: Out of bed for meals;Seated upright 90 degrees;Upright 30-60 min after meal                 (Palliative Care for ed, GOC; Dietician) Oral care BID;Oral care before and after PO;Staff/trained caregiver to provide oral care (Denture care)   Frequent or constant Supervision/Assistance Dysphagia, oral phase (R13.11) (impact from Alzheimer's Dementia/Confusion; not feeding self)     All goals met       Comer Portugal, MS, CCC-SLP Speech Language Pathologist Rehab Services; [facility] [phone] (ascom) Dafne Nield  02/28/2024, 1:27 PM

## 2024-02-28 NOTE — NC FL2 (Signed)
 [place]  MEDICAID [id number] LEVEL OF CARE FORM     IDENTIFICATION  Patient Name: Leslie Osborn Birthdate: 03/28/1926 Sex: female Admission Date (Current Location): 02/25/2024  [facility] and [place] Number:  Chiropodist and Address:  [facility], [address], [place], [place] [address]      Provider Number: [id number]  Attending Physician Name and Address:  Trudy Anthony HERO, MD  Relative Name and Phone Number:  Bland,Wilma(Billie) (Sister)  [phone]    Current Level of Care: Hospital Recommended Level of Care: Skilled Nursing Facility Prior Approval Number:    Date Approved/Denied:   PASRR Number:    Discharge Plan: SNF    Current Diagnoses: Patient Active Problem List   Diagnosis Date Noted   Acute metabolic encephalopathy 08/31/2021   Hypothyroidism 08/31/2021   Alzheimer disease (HCC) 08/31/2021   Fall 08/31/2021   Atrial fibrillation, chronic (HCC) 08/31/2021   HLD (hyperlipidemia) 08/31/2021   HTN (hypertension) 08/31/2021   Depression 08/31/2021   Personal history of breast cancer 12/11/2012   Hypertension 1991    Orientation RESPIRATION BLADDER Height & Weight     Self    Incontinent Weight: 56.5 kg Height:  5' 4 (162.6 cm)  BEHAVIORAL SYMPTOMS/MOOD NEUROLOGICAL BOWEL NUTRITION STATUS      Incontinent Diet (Dysphagia II: extra gravy on meats and potatoes)  AMBULATORY STATUS COMMUNICATION OF NEEDS Skin   Extensive Assist Verbally                         Personal Care Assistance Level of Assistance  Bathing, Feeding, Dressing Bathing Assistance: Limited assistance Feeding assistance: Limited assistance Dressing Assistance: Limited assistance     Functional Limitations Info             SPECIAL CARE FACTORS FREQUENCY  PT (By licensed PT), OT (By licensed OT)     PT Frequency: 5 x week OT Frequency: 5 x week            Contractures      Additional Factors Info  Code Status, Allergies Code  Status Info: DNR Allergies Info: Donepezil Hcl           Current Medications (02/28/2024):  This is the current hospital active medication list Current Facility-Administered Medications  Medication Dose Route Frequency Provider Last Rate Last Admin   acetaminophen  (TYLENOL ) tablet 650 mg  650 mg Oral Q4H PRN Keene, Jeremiah D, NP       Or   acetaminophen  (TYLENOL ) 160 MG/5ML solution 650 mg  650 mg Per Tube Q4H PRN Keene, Jeremiah D, NP       Or   acetaminophen  (TYLENOL ) suppository 650 mg  650 mg Rectal Q4H PRN Keene, Jeremiah D, NP       amLODipine  (NORVASC ) tablet 2.5 mg  2.5 mg Oral Daily Nelson, Dana G, NP   2.5 mg at 02/28/24 1035   Chlorhexidine  Gluconate Cloth 2 % PADS 6 each  6 each Topical Daily Kasa, Kurian, MD   6 each at 02/27/24 0850   escitalopram  (LEXAPRO ) tablet 5 mg  5 mg Oral Daily Nelson, Dana G, NP   5 mg at 02/28/24 1035   hydrALAZINE  (APRESOLINE ) injection 10-20 mg  10-20 mg Intravenous Q4H PRN Nelson, Dana G, NP   20 mg at 02/28/24 0603   hydrochlorothiazide  (HYDRODIURIL ) tablet 12.5 mg  12.5 mg Oral Daily Nelson, Dana G, NP   12.5 mg at 02/28/24 1035   levothyroxine  (SYNTHROID ) tablet 75 mcg  75 mcg Oral QAC breakfast Nelson, Dana G, NP   75 mcg at 02/28/24 9485   melatonin tablet 5 mg  5 mg Oral QHS Rust-Chester, Jenita CROME, NP   5 mg at 02/27/24 2137   Oral care mouth rinse  15 mL Mouth Rinse PRN Kasa, Kurian, MD       pantoprazole  (PROTONIX ) injection 40 mg  40 mg Intravenous QHS Keene, Jeremiah D, NP   40 mg at 02/27/24 2137   senna-docusate (Senokot-S) tablet 1 tablet  1 tablet Oral BID Keene, Jeremiah D, NP   1 tablet at 02/28/24 1035   simvastatin  (ZOCOR ) tablet 20 mg  20 mg Oral Daily Nelson, Dana G, NP   20 mg at 02/28/24 1035     Discharge Medications: Please see discharge summary for a list of discharge medications.  Relevant Imaging Results:  Relevant Lab Results:   Additional Information SSN [id number]  Dalia GORMAN Fuse, RN

## 2024-02-28 NOTE — Progress Notes (Signed)
 PHARMACIST - PHYSICIAN COMMUNICATION  CONCERNING:  Enoxaparin  (Lovenox ) for DVT Prophylaxis    RECOMMENDATION: Patient was prescribed enoxaprin 40mg  q24 hours for VTE prophylaxis.   Filed Weights   02/25/24 1015  Weight: 56.5 kg (124 lb 8 oz)    Body mass index is 21.37 kg/m.  Estimated Creatinine Clearance: 24.3 mL/min (A) (by C-G formula based on SCr of 1.17 mg/dL (H)).   Patient is candidate for enoxaparin  30mg  every 24 hours based on CrCl <54ml/min or Weight <45kg  DESCRIPTION: Pharmacy has adjusted enoxaparin  dose per [facility] policy.  Patient is now receiving enoxaparin  30 mg every 24 hours    Kayla JULIANNA Blew, PharmD Clinical Pharmacist  02/28/2024 11:31 AM

## 2024-02-28 NOTE — Progress Notes (Signed)
 Occupational Therapy Treatment Patient Details Name: Leslie Osborn MRN: [id number] DOB: May 12, 1926 Today's Date: 02/28/2024   History of present illness 89 y/o female presented to ED on 02/25/24 from [place] [facility] [place] for unwitnessed fall with acute R sided hemiparesis. MRI showed acute parenchymal hematoma in posterior left frontal lobe. PMH: Alzheimer's dementia, Afib, HTN, hx of breast and colon cancer, macular degeneration   OT comments  Pt seen for OT and PT co-tx to optimize safety and mobility efforts. Pt agreeable, sister present. Pt continues to require MAX A +2 for bed mobility and standing attempts but was improved from previous date's attempts. Pt engaged in dynamic reaching outside BOS both to the L and crossing midline to the R, composite finger flexion/extension with cues for visual attention to task, and weightbearing through RUE on bed with dynamic reaching/leaning. Pt continues to benefit from skilled OT Services.   Pt engaged in dynamic reaching outside BOS both to the L and crossing midline to the R, composite finger flexion/extension with cues for visual attention to task, and weightbearing through RUE on bed with dynamic reaching/leaning.       If plan is discharge home, recommend the following:  Two people to help with walking and/or transfers;A lot of help with bathing/dressing/bathroom;Assistance with cooking/housework;Assist for transportation;Help with stairs or ramp for entrance;Direct supervision/assist for medications management;Direct supervision/assist for financial management;Supervision due to cognitive status   Equipment Recommendations  Other (comment) (defer)    Recommendations for Other Services      Precautions / Restrictions Precautions Precautions: Fall Recall of Precautions/Restrictions: Impaired Restrictions Weight Bearing Restrictions Per Provider Order: No       Mobility Bed Mobility Overal bed mobility: Needs Assistance Bed Mobility: Supine  to Sit, Sit to Supine     Supine to sit: Max assist, +2 for physical assistance, +2 for safety/equipment Sit to supine: Max assist, +2 for physical assistance, +2 for safety/equipment        Transfers Overall transfer level: Needs assistance Equipment used: 2 person hand held assist Transfers: Sit to/from Stand Sit to Stand: Max assist, Total assist, +2 physical assistance, +2 safety/equipment                 Balance Overall balance assessment: Needs assistance Sitting-balance support: No upper extremity supported, Feet supported, Single extremity supported Sitting balance-Leahy Scale: Fair Sitting balance - Comments: fair static balance, poor dynamic balance. Required up to modA to correct R lateral lean Postural control: Right lateral lean, Posterior lean Standing balance support: Single extremity supported, During functional activity Standing balance-Leahy Scale: Zero                             ADL either performed or assessed with clinical judgement   ADL Overall ADL's : Needs assistance/impaired     Grooming: Sitting;Set up;Wash/dry face;Contact guard assist Grooming Details (indicate cue type and reason): VC and visual cue to initiate using L non dom hand             Lower Body Dressing: Bed level;Maximal assistance Lower Body Dressing Details (indicate cue type and reason): socks                    Extremity/Trunk Assessment              Vision       Perception     Praxis     Communication Communication Communication: No apparent difficulties   Cognition  Arousal: Alert Behavior During Therapy: WFL for tasks assessed/performed Cognition: Cognition impaired     Awareness: Intellectual awareness impaired, Online awareness impaired Memory impairment (select all impairments): Short-term memory, Working [employer] fast streamer, Armed forces training and education officer functioning impairment (select all impairments): Initiation,  Organization, Sequencing, Reasoning, Problem solving                   Following commands: Impaired Following commands impaired: Follows one step commands inconsistently, Follows one step commands with increased time      Cueing   Cueing Techniques: Verbal cues, Gestural cues, Tactile cues, Visual cues  Exercises Other Exercises Other Exercises: Pt engaged in dynamic reaching outside BOS both to the L and crossing midline to the R, composite finger flexion/extension with cues for visual attention to task, and weightbearing through RUE on bed with dynamic reaching/leaning.    Shoulder Instructions       General Comments      Pertinent Vitals/ Pain       Pain Assessment Pain Assessment: Faces Faces Pain Scale: No hurt  Home Living                                          Prior Functioning/Environment              Frequency  Min 3X/week        Progress Toward Goals  OT Goals(current goals can now be found in the care plan section)  Progress towards OT goals: Progressing toward goals  Acute Rehab OT Goals Patient Stated Goal: get better and go home OT Goal Formulation: With patient Time For Goal Achievement: 03/12/24 Potential to Achieve Goals: Fair  Plan      Co-evaluation    PT/OT/SLP Co-Evaluation/Treatment: Yes Reason for Co-Treatment: Necessary to address cognition/behavior during functional activity;Complexity of the patient's impairments (multi-system involvement);For patient/therapist safety PT goals addressed during session: Mobility/safety with mobility;Balance OT goals addressed during session: ADL's and self-care      AM-PAC OT 6 Clicks Daily Activity     Outcome Measure   Help from another person eating meals?: A Little Help from another person taking care of personal grooming?: A Little Help from another person toileting, which includes using toliet, bedpan, or urinal?: Total Help from another person bathing  (including washing, rinsing, drying)?: A Lot Help from another person to put on and taking off regular upper body clothing?: A Lot Help from another person to put on and taking off regular lower body clothing?: A Lot 6 Click Score: 13    End of Session    OT Visit Diagnosis: Other abnormalities of gait and mobility (R26.89);Hemiplegia and hemiparesis;Pain Hemiplegia - Right/Left: Right Hemiplegia - dominant/non-dominant: Dominant Hemiplegia - caused by: Nontraumatic intracerebral hemorrhage Pain - Right/Left: Right Pain - part of body: Shoulder   Activity Tolerance Patient tolerated treatment well   Patient Left in bed;with bed alarm set;with call bell/phone within reach;with family/visitor present   Nurse Communication Mobility status        Time: 8876-8843 OT Time Calculation (min): 33 min  Charges: OT General Charges $OT Visit: 1 Visit OT Treatments $Therapeutic Activity: 8-22 mins  Warren SAUNDERS., MPH, MS, OTR/L ascom [phone] 02/28/24, 2:31 PM

## 2024-02-28 NOTE — Progress Notes (Signed)
 PHARMACIST - PHYSICIAN COMMUNICATION CONCERNING: IV to Oral Route Change Policy  RECOMMENDATION: This patient is receiving pantoprazole  intravenously. Based on criteria approved by the Pharmacy and Therapeutics Committee, the intravenous medication(s) is/are being converted to the equivalent dose of an oral formulation.   DESCRIPTION: These criteria include: The patient is eating (either orally or via tube) and/or has been taking other orally administered medications for at least 24 hours. The patient has no evidence of active gastrointestinal bleeding or malabsorptive GI state (gastrectomy; short bowel; patient on TNA or NPO).  If you have questions about this conversion, please contact the Pharmacy Department:  [x]   [phone] )  [facility] []   [phone] )  [facility] []   [phone] )  [facility] []   [phone] )  [facility]   Will M. Lenon, PharmD, BCPS Clinical Pharmacist 02/28/2024 3:54 PM

## 2024-02-28 NOTE — Progress Notes (Signed)
 Physical Therapy Treatment Patient Details Name: Leslie Osborn MRN: [id number] DOB: [birth year] Today's Date: 02/28/2024   History of Present Illness 89 y/o female presented to ED on 02/25/24 from [place] [facility] [place] for unwitnessed fall with acute R sided hemiparesis. MRI showed acute parenchymal hematoma in posterior left frontal lobe. PMH: Alzheimer's dementia, Afib, HTN, hx of breast and colon cancer, macular degeneration    PT Comments  Patient showing improvement in R UE/LE movement this date. Able to open/close R hand and initiate R knee flexion/extension and hip flexion. Patient required maxA+2 for bed mobility. Initially sitting on EOB, required maxA, however progressed to moments of CGA for static sitting balance and up to modA for dynamic sitting balance. Stood from EOB with totalA+2 and HHAx2. Following commands more consistently this date with increased time allowed. Sister present throughout and encouraging. Discharge plan remains appropriate.     If plan is discharge home, recommend the following: Two people to help with walking and/or transfers;Two people to help with bathing/dressing/bathroom;Assistance with cooking/housework;Assistance with feeding;Direct supervision/assist for medications management;Direct supervision/assist for financial management;Assist for transportation;Supervision due to cognitive status   Can travel by private vehicle     No  Equipment Recommendations  Other (comment) (TBD)    Precautions / Restrictions Precautions Precautions: Fall Recall of Precautions/Restrictions: Impaired Restrictions Weight Bearing Restrictions Per Provider Order: No     Mobility  Bed Mobility Overal bed mobility: Needs Assistance Bed Mobility: Supine to Sit, Sit to Supine     Supine to sit: Max assist, +2 for physical assistance, +2 for safety/equipment Sit to supine: Max assist, +2 for physical assistance, +2 for safety/equipment        Transfers Overall transfer  level: Needs assistance Equipment used: 2 person hand held assist Transfers: Sit to/from Stand Sit to Stand: Max assist, Total assist, +2 physical assistance, +2 safety/equipment                Ambulation/Gait                   Stairs             Wheelchair Mobility     Tilt Bed    Modified Rankin (Stroke Patients Only)       Balance Overall balance assessment: Needs assistance Sitting-balance support: No upper extremity supported, Feet supported Sitting balance-Leahy Scale: Fair Sitting balance - Comments: fair static balance, poor dynamic balance. Required up to modA to correct R lateral lean Postural control: Right lateral lean, Posterior lean Standing balance support: Single extremity supported, During functional activity Standing balance-Leahy Scale: Zero                              Communication Communication Communication: No apparent difficulties  Cognition Arousal: Alert Behavior During Therapy: WFL for tasks assessed/performed   PT - Cognitive impairments: History of cognitive impairments                         Following commands: Impaired Following commands impaired: Follows one step commands inconsistently, Follows one step commands with increased time    Cueing Cueing Techniques: Verbal cues, Gestural cues, Tactile cues, Visual cues  Exercises      General Comments        Pertinent Vitals/Pain Pain Assessment Pain Assessment: Faces Faces Pain Scale: No hurt Pain Intervention(s): Monitored during session    Home Living  Prior Function            PT Goals (current goals can now be found in the care plan section) Acute Rehab PT Goals Patient Stated Goal: did not state PT Goal Formulation: With family Time For Goal Achievement: 03/12/24 Potential to Achieve Goals: Fair Progress towards PT goals: Progressing toward goals    Frequency    Min 3X/week       PT Plan      Co-evaluation PT/OT/SLP Co-Evaluation/Treatment: Yes Reason for Co-Treatment: Necessary to address cognition/behavior during functional activity;Complexity of the patient's impairments (multi-system involvement);For patient/therapist safety PT goals addressed during session: Mobility/safety with mobility;Balance        AM-PAC PT 6 Clicks Mobility   Outcome Measure  Help needed turning from your back to your side while in a flat bed without using bedrails?: Total Help needed moving from lying on your back to sitting on the side of a flat bed without using bedrails?: Total Help needed moving to and from a bed to a chair (including a wheelchair)?: Total Help needed standing up from a chair using your arms (e.g., wheelchair or bedside chair)?: Total Help needed to walk in hospital room?: Total Help needed climbing 3-5 steps with a railing? : Total 6 Click Score: 6    End of Session   Activity Tolerance: Patient tolerated treatment well Patient left: in bed;with call bell/phone within reach;with bed alarm set;with nursing/sitter in room Nurse Communication: Mobility status PT Visit Diagnosis: Muscle weakness (generalized) (M62.81);Other abnormalities of gait and mobility (R26.89);Other symptoms and signs involving the nervous system (R29.898)     Time: 8876-8843 PT Time Calculation (min) (ACUTE ONLY): 33 min  Charges:    $Therapeutic Activity: 8-22 mins PT General Charges $$ ACUTE PT VISIT: 1 Visit                     Maryanne Finder, PT, DPT Physical Therapist - [facility]  [facility]    Assia Meanor A Nixie Laube 02/28/2024, 12:32 PM

## 2024-02-28 NOTE — TOC Progression Note (Addendum)
 Transition of Care [facility]) - Progression Note    Patient Details  Name: TIANNI ESCAMILLA MRN: [id number] Date of Birth: 26-Oct-1925  Transition of Care [facility] CM/SW Contact  Dalia GORMAN Fuse, RN Phone Number: 02/28/2024, 10:53 AM  Clinical Narrative:    Patient is A&O X 1. Therapy reccomends SNF for STR. TOC lvmm for the patient's sister Michaelle [phone] requesting callback. FL2 completed. TOC will send out FL2 in [place] and offer choice when family makes contact.  PASRR screening submitted, additional review is required. TOC uploaded H&P, Progress Notes, Signed 30 day Note, and Signed FL2.                    Expected Discharge Plan and Services                                               Social Drivers of Health (SDOH) Interventions SDOH Screenings   Food Insecurity: Patient Unable To Answer (02/26/2024)  Housing: Patient Unable To Answer (02/26/2024)  Transportation Needs: Patient Unable To Answer (02/26/2024)  Utilities: Patient Unable To Answer (02/26/2024)  Social Connections: Unknown (02/26/2024)  Tobacco Use: Medium Risk (02/25/2024)    Readmission Risk Interventions     No data to display

## 2024-02-28 NOTE — Progress Notes (Signed)
 PROGRESS NOTE    Leslie Osborn  FMW:[id number] DOB: 01-16-1926 DOA: 02/25/2024 PCP: Valora Agent, MD   Assessment & Plan:   Active Problems:   * No active hospital problems. *  Assessment and Plan:  Acute intracerebral hemorrhage: continue w/ supportive care. Keep SBP 130-150. Continue w/ neuro checks   Acute metabolic encephalopathy: likely secondary to above. Hx of dementia. Continue w/ supportive care   HTN: continue on amlodipine , HCTZ  HLD: continue on statin   PAF: rate controlled currently. Not on anticoagulation secondary to high fall risk  Thrombocytopenia: labile. Will continue to monitor  DVT prophylaxis: lovenox  Code Status: DNR Family Communication: discussed pt's care w/ pt's sister at bedside and answered her questions  Disposition Plan: likely d/c to SNF.  Level of care: Telemetry Medical  Status is: Inpatient Remains inpatient appropriate because: severity of illness    Consultants:  Neuro ICU   Procedures:   Antimicrobials:    Subjective: Pt is pleasantly confused  Objective: Vitals:   02/27/24 2344 02/28/24 0349 02/28/24 0633 02/28/24 0757  BP:  (!) 159/58 135/62 127/65  Pulse: 77 (!) 58 67 (!) 58  Resp: 18 18  20   Temp: 98.2 F (36.8 C) 97.8 F (36.6 C) 97.6 F (36.4 C) 97.9 F (36.6 C)  TempSrc: Oral Oral    SpO2: 99% 99% 97% 96%  Weight:      Height:        Intake/Output Summary (Last 24 hours) at 02/28/2024 0850 Last data filed at 02/27/2024 1900 Gross per 24 hour  Intake 833.81 ml  Output --  Net 833.81 ml   Filed Weights   02/25/24 1015  Weight: 56.5 kg    Examination:  General exam: Appears calm and comfortable but confused Respiratory system: Clear to auscultation. Respiratory effort normal. Cardiovascular system: S1 & S2+. No rubs, gallops or clicks.  Gastrointestinal system: Abdomen is nondistended, soft and nontender.  Normal bowel sounds heard. Central nervous system: Alert and awake Psychiatry:  Judgement and insight appears poor. Flat mood and affect    Data Reviewed: I have personally reviewed following labs and imaging studies  CBC: Recent Labs  Lab 02/25/24 1025 02/26/24 0521 02/27/24 0209 02/28/24 0346  WBC 5.7 7.3 7.7 6.7  NEUTROABS 4.5  --   --   --   HGB 13.6 13.6 14.3 14.8  HCT 42.4 40.1 43.7 43.9  MCV 99.1 95.5 97.3 95.4  PLT 120* 160 160 146*   Basic Metabolic Panel: Recent Labs  Lab 02/25/24 1025 02/26/24 0521 02/27/24 0209 02/28/24 0346  NA 138 139 138 135  K 3.8 3.9 3.5 3.5  CL 99 100 97* 97*  CO2 26 28 28 27   GLUCOSE 100* 90 87 86  BUN 28* 28* 31* 36*  CREATININE 0.95 1.01* 1.17* 1.17*  CALCIUM 9.2 9.3 9.4 9.1  MG  --  1.9 1.8 2.7*  PHOS  --  3.5 3.0 3.4   GFR: Estimated Creatinine Clearance: 24.3 mL/min (A) (by C-G formula based on SCr of 1.17 mg/dL (H)). Liver Function Tests: Recent Labs  Lab 02/25/24 1025 02/26/24 0521 02/27/24 0209 02/28/24 0346  AST 29  --   --   --   ALT 11  --   --   --   ALKPHOS 53  --   --   --   BILITOT 0.8  --   --   --   PROT 6.7  --   --   --   ALBUMIN 3.7 3.6  3.8 3.7   No results for input(s): LIPASE, AMYLASE in the last 168 hours. No results for input(s): AMMONIA in the last 168 hours. Coagulation Profile: Recent Labs  Lab 02/25/24 1025  INR 1.1   Cardiac Enzymes: Recent Labs  Lab 02/25/24 1025  CKTOTAL 57   BNP (last 3 results) No results for input(s): PROBNP in the last 8760 hours. HbA1C: No results for input(s): HGBA1C in the last 72 hours. CBG: Recent Labs  Lab 02/25/24 1620  GLUCAP 96   Lipid Profile: Recent Labs    02/27/24 0209  TRIG 42   Thyroid  Function Tests: Recent Labs    02/26/24 0938  TSH 3.187  T3FREE 1.6*   Anemia Panel: No results for input(s): VITAMINB12, FOLATE, FERRITIN, TIBC, IRON, RETICCTPCT in the last 72 hours. Sepsis Labs: No results for input(s): PROCALCITON, LATICACIDVEN in the last 168 hours.  Recent Results (from  the past 240 hours)  MRSA Next Gen by PCR, Nasal     Status: None   Collection Time: 02/25/24  4:37 PM   Specimen: Nasal Mucosa; Nasal Swab  Result Value Ref Range Status   MRSA by PCR Next Gen NOT DETECTED NOT DETECTED Final    Comment: (NOTE) The GeneXpert MRSA Assay (FDA approved for NASAL specimens only), is one component of a comprehensive MRSA colonization surveillance program. It is not intended to diagnose MRSA infection nor to guide or monitor treatment for MRSA infections. Test performance is not FDA approved in patients less than 65 years old. Performed at [facility], [address] [place], [place] [address]          Radiology Studies: MR BRAIN WO CONTRAST Result Date: 02/26/2024 EXAM: MRI BRAIN WITHOUT CONTRAST 02/26/2024 12:35:22 PM TECHNIQUE: Multiplanar multisequence MRI of the head/brain was performed without the administration of intravenous contrast. COMPARISON: CTA head and neck 02/25/2024 CLINICAL HISTORY: Stroke, follow up. 89 y.o. female with history of A-fib who comes in with concerns for fall. I reviewed a note from 11/15/2023 where patient was seen by cardiology for her A-fib. She is on metoprolol . It looks like she is on 12.5 twice daily. She is not on any blood thinners due to high risk of falling. Attempted calling Ms Benjamine- no answer. Tried calling Tom bland no answer. Per family she was normal at 2pm. Patient's sister is now at bedside he does report that she has never had the right sided weakness in the past. She reports at 2 PM yesterday she seemed to be normal but unclear if anyone saw her normal after that. FINDINGS: BRAIN AND VENTRICLES: Prominent susceptibility and signal abnormality within the posterior left frontal lobe involving the left precentral gyrus compatible with acute parenchymal hematoma. There is surrounding edema and mild local mass effect. No midline shift. There are nonspecific hyperintense foci in the subcortical and  periventricular white matter that most likely represent chronic microangiopathic ischemic changes in a patient of this age. Mild parenchymal volume loss. There are punctate foci of susceptibility within the pons which may be related to hypertension. ORBITS: Bilateral lens replacement. SINUSES AND MASTOIDS: No acute abnormality. BONES AND SOFT TISSUES: Normal marrow signal. No acute soft tissue abnormality. IMPRESSION: 1. Acute parenchymal hematoma in the posterior left frontal lobe involving the left precentral gyrus. Mild surrounding edema and local mass effect. No midline shift. 2. Punctate foci of susceptibility within the pons, possibly related to hypertension. 3. Chronic microangiopathic ischemic changes. 4. Mild parenchymal volume loss. Electronically signed by: Donnice Mania MD 02/26/2024 01:48  PM EDT RP Workstation: [id number]   ECHOCARDIOGRAM COMPLETE Result Date: 02/26/2024    ECHOCARDIOGRAM REPORT   Patient Name:   Leslie Osborn Date of Exam: 02/26/2024 Medical Rec #:  [id number]      Height:       64.0 in Accession #:    [id number]     Weight:       124.5 lb Date of Birth:  August 01, 1925      BSA:          1.599 m Patient Age:    89 years       BP:           154/81 mmHg Patient Gender: F              HR:           61 bpm. Exam Location:  [facility] Procedure: 2D Echo, Cardiac Doppler and Color Doppler (Both Spectral and Color            Flow Doppler were utilized during procedure).          REPORT CONTAINS CRITICAL RESULT Reported to: Timothy Gollan on 02/26/2024 9:13:00 AM              Severe Aortic Stenosis. Indications:     Stroke I63.9  History:         Patient has no prior history of Echocardiogram examinations.                  Stroke.  Sonographer:     Ashley McNeely-Sloane Referring Phys:  [id number] INGE JONETTA LECHER Diagnosing Phys: Evalene Lunger MD  Sonographer Comments: Altered mental status. IMPRESSIONS  1. Left ventricular ejection fraction, by estimation, is 60 to 65%. The left ventricle has normal  function. The left ventricle has no regional wall motion abnormalities. There is moderate left ventricular hypertrophy. Left ventricular diastolic parameters are indeterminate.  2. Right ventricular systolic function is normal. The right ventricular size is normal.  3. The mitral valve is normal in structure. Mild mitral valve regurgitation. No evidence of mitral stenosis. Severe mitral annular calcification.  4. The aortic valve is calcified. Aortic valve regurgitation is not visualized. Severe aortic valve stenosis. Aortic valve area, by VTI measures 0.58 cm. Aortic valve mean gradient measures 36.7 mmHg. Aortic valve Vmax measures 3.99 m/s.  5. The inferior vena cava is normal in size with greater than 50% respiratory variability, suggesting right atrial pressure of 3 mmHg. FINDINGS  Left Ventricle: Left ventricular ejection fraction, by estimation, is 60 to 65%. The left ventricle has normal function. The left ventricle has no regional wall motion abnormalities. Strain was performed and the global longitudinal strain is indeterminate. The left ventricular internal cavity size was normal in size. There is moderate left ventricular hypertrophy. Left ventricular diastolic parameters are indeterminate. Right Ventricle: The right ventricular size is normal. No increase in right ventricular wall thickness. Right ventricular systolic function is normal. Left Atrium: Left atrial size was normal in size. Right Atrium: Right atrial size was normal in size. Pericardium: There is no evidence of pericardial effusion. Mitral Valve: The mitral valve is normal in structure. There is mild calcification of the mitral valve leaflet(s). Severe mitral annular calcification. Mild mitral valve regurgitation. No evidence of mitral valve stenosis. MV peak gradient, 7.8 mmHg. The  mean mitral valve gradient is 2.0 mmHg. Tricuspid Valve: The tricuspid valve is normal in structure. Tricuspid valve regurgitation is not demonstrated. No  evidence of tricuspid stenosis. Aortic Valve: The  aortic valve is calcified. Aortic valve regurgitation is not visualized. Severe aortic stenosis is present. Aortic valve mean gradient measures 36.7 mmHg. Aortic valve peak gradient measures 63.8 mmHg. Aortic valve area, by VTI measures  0.58 cm. Pulmonic Valve: The pulmonic valve was normal in structure. Pulmonic valve regurgitation is not visualized. No evidence of pulmonic stenosis. Aorta: The aortic root is normal in size and structure. Venous: The inferior vena cava is normal in size with greater than 50% respiratory variability, suggesting right atrial pressure of 3 mmHg. IAS/Shunts: No atrial level shunt detected by color flow Doppler. Additional Comments: 3D was performed not requiring image post processing on an independent workstation and was indeterminate.  LEFT VENTRICLE PLAX 2D LVIDd:         4.10 cm     Diastology LVIDs:         2.30 cm     LV e' medial:    5.18 cm/s LV PW:         1.40 cm     LV E/e' medial:  19.5 LV IVS:        1.30 cm     LV e' lateral:   6.00 cm/s LVOT diam:     1.80 cm     LV E/e' lateral: 16.8 LV SV:         51 LV SV Index:   32 LVOT Area:     2.54 cm  LV Volumes (MOD) LV vol d, MOD A2C: 40.9 ml LV vol d, MOD A4C: 25.6 ml LV vol s, MOD A2C: 22.3 ml LV vol s, MOD A4C: 11.2 ml LV SV MOD A2C:     18.6 ml LV SV MOD A4C:     25.6 ml LV SV MOD BP:      16.7 ml RIGHT VENTRICLE             IVC RV S prime:     10.70 cm/s  IVC diam: 1.65 cm TAPSE (M-mode): 1.3 cm LEFT ATRIUM             Index        RIGHT ATRIUM           Index LA diam:        4.30 cm 2.69 cm/m   RA Area:     25.60 cm LA Vol (A2C):   96.7 ml 60.46 ml/m  RA Volume:   84.20 ml  52.64 ml/m LA Vol (A4C):   62.0 ml 38.76 ml/m LA Biplane Vol: 82.1 ml 51.33 ml/m  AORTIC VALVE                     PULMONIC VALVE AV Area (Vmax):    0.55 cm      PV Vmax:        1.28 m/s AV Area (Vmean):   0.51 cm      PV Vmean:       85.600 cm/s AV Area (VTI):     0.58 cm      PV VTI:          0.264 m AV Vmax:           399.33 cm/s   PV Peak grad:   6.6 mmHg AV Vmean:          284.667 cm/s  PV Mean grad:   3.0 mmHg AV VTI:            0.883 m       RVOT Peak grad:  5 mmHg AV Peak Grad:      63.8 mmHg AV Mean Grad:      36.7 mmHg LVOT Vmax:         85.90 cm/s LVOT Vmean:        57.600 cm/s LVOT VTI:          0.200 m LVOT/AV VTI ratio: 0.23  AORTA Ao Root diam: 3.00 cm Ao Asc diam:  2.70 cm MITRAL VALVE                TRICUSPID VALVE MV Area (PHT): 3.42 cm     TR Peak grad:   37.2 mmHg MV Area VTI:   1.11 cm     TR Mean grad:   26.0 mmHg MV Peak grad:  7.8 mmHg     TR Vmax:        305.00 cm/s MV Mean grad:  2.0 mmHg     TR Vmean:       246.0 cm/s MV Vmax:       1.40 m/s MV Vmean:      55.8 cm/s    SHUNTS MV Decel Time: 222 msec     Systemic VTI:  0.20 m MV E velocity: 101.00 cm/s  Systemic Diam: 1.80 cm MV A velocity: 34.10 cm/s   Pulmonic VTI:  0.256 m MV E/A ratio:  2.96 Evalene Lunger MD Electronically signed by Evalene Lunger MD Signature Date/Time: 02/26/2024/1:39:48 PM    Final         Scheduled Meds:  amLODipine   2.5 mg Oral Daily   Chlorhexidine  Gluconate Cloth  6 each Topical Daily   escitalopram   5 mg Oral Daily   hydrochlorothiazide   12.5 mg Oral Daily   levothyroxine   75 mcg Oral QAC breakfast   melatonin  5 mg Oral QHS   pantoprazole  (PROTONIX ) IV  40 mg Intravenous QHS   senna-docusate  1 tablet Oral BID   simvastatin   20 mg Oral Daily   Continuous Infusions:   LOS: 3 days      Anthony CHRISTELLA Pouch, MD [facility] Pager [phone]  If 7PM-7AM, please contact night-coverage [email or web address] 02/28/2024, 8:50 AM

## 2024-02-29 DIAGNOSIS — S0636AA Traumatic hemorrhage of cerebrum, unspecified, with loss of consciousness status unknown, initial encounter: Secondary | ICD-10-CM | POA: Diagnosis not present

## 2024-02-29 MED ORDER — HYDROXYZINE HCL 10 MG PO TABS
10.0000 mg | ORAL_TABLET | Freq: Once | ORAL | Status: AC
  Administered 2024-02-29: 10 mg via ORAL
  Filled 2024-02-29 (×2): qty 1

## 2024-02-29 NOTE — Plan of Care (Signed)

## 2024-02-29 NOTE — Progress Notes (Signed)
 Physical Therapy Treatment Patient Details Name: Leslie Osborn MRN: [id number] DOB: 17-Dec-1925 Today's Date: 02/29/2024   History of Present Illness 89 y/o female presented to ED on 02/25/24 from [facility] for unwitnessed fall with acute R sided hemiparesis. MRI showed acute parenchymal hematoma in posterior left frontal lobe. PMH: Alzheimer's dementia, Afib, HTN, hx of breast and colon cancer, macular degeneration    PT Comments  Patient reclined in bed on arrival and pleasantly confused. Required maxA for bed mobility and CGA-modA for sitting balance (static vs dynamic). Engaged in dynamic reaching towards L and crossing midline to R. Patient with trace quad activation in sitting on command, however noted quad activation with movement in supine. Continues to demonstrate significant weakness in R UE and LE. Discharge plan remains appropriate.     If plan is discharge home, recommend the following: Two people to help with walking and/or transfers;Two people to help with bathing/dressing/bathroom;Assistance with cooking/housework;Assistance with feeding;Direct supervision/assist for medications management;Direct supervision/assist for financial management;Assist for transportation;Supervision due to cognitive status   Can travel by private vehicle     No  Equipment Recommendations  Other (comment) (TBD)    Recommendations for Other Services       Precautions / Restrictions Precautions Precautions: Fall Recall of Precautions/Restrictions: Impaired Restrictions Weight Bearing Restrictions Per Provider Order: No     Mobility  Bed Mobility Overal bed mobility: Needs Assistance Bed Mobility: Supine to Sit, Sit to Supine     Supine to sit: Max assist Sit to supine: Max assist        Transfers                   General transfer comment: deferred due to lack of +2 assistance    Ambulation/Gait                   Stairs             Wheelchair  Mobility     Tilt Bed    Modified Rankin (Stroke Patients Only)       Balance Overall balance assessment: Needs assistance Sitting-balance support: No upper extremity supported, Feet supported, Single extremity supported Sitting balance-Leahy Scale: Fair Sitting balance - Comments: fair static balance, poor dynamic balance. Required up to modA to correct R lateral lean                                    Communication Communication Communication: No apparent difficulties  Cognition Arousal: Alert Behavior During Therapy: WFL for tasks assessed/performed   PT - Cognitive impairments: History of cognitive impairments                       PT - Cognition Comments: Oriented to self, follows commands inconsistently Following commands: Impaired Following commands impaired: Follows one step commands inconsistently, Follows one step commands with increased time    Cueing Cueing Techniques: Verbal cues, Gestural cues, Tactile cues, Visual cues  Exercises Other Exercises Other Exercises: Engaged in dynamic reaching outside of BOS to L and crossing midline to R as well as weightbearing through RUE in lateral leaning on R UE    General Comments        Pertinent Vitals/Pain Pain Assessment Pain Assessment: Faces Faces Pain Scale: No hurt Pain Intervention(s): Monitored during session    Home Living  Prior Function            PT Goals (current goals can now be found in the care plan section) Acute Rehab PT Goals Patient Stated Goal: did not state PT Goal Formulation: With family Time For Goal Achievement: 03/12/24 Potential to Achieve Goals: Fair Progress towards PT goals: Progressing toward goals    Frequency    Min 3X/week      PT Plan      Co-evaluation              AM-PAC PT 6 Clicks Mobility   Outcome Measure  Help needed turning from your back to your side while in a flat bed without  using bedrails?: A Lot Help needed moving from lying on your back to sitting on the side of a flat bed without using bedrails?: A Lot Help needed moving to and from a bed to a chair (including a wheelchair)?: Total Help needed standing up from a chair using your arms (e.g., wheelchair or bedside chair)?: Total Help needed to walk in hospital room?: Total Help needed climbing 3-5 steps with a railing? : Total 6 Click Score: 8    End of Session   Activity Tolerance: Patient tolerated treatment well Patient left: in bed;with call bell/phone within reach;with bed alarm set;with nursing/sitter in room Nurse Communication: Mobility status PT Visit Diagnosis: Muscle weakness (generalized) (M62.81);Other abnormalities of gait and mobility (R26.89);Other symptoms and signs involving the nervous system (R29.898)     Time: 8570-8541 PT Time Calculation (min) (ACUTE ONLY): 29 min  Charges:    $Therapeutic Activity: 23-37 mins PT General Charges $$ ACUTE PT VISIT: 1 Visit                     Maryanne Finder, PT, DPT Physical Therapist - [facility]  [facility]    Leslie Osborn A Leslie Osborn 02/29/2024, 3:06 PM

## 2024-02-29 NOTE — TOC Progression Note (Signed)
 Transition of Care [facility]) - Progression Note    Patient Details  Name: Leslie Osborn MRN: [id number] Date of Birth: Nov 29, 1925  Transition of [facility]) CM/SW Contact  Marinda Cooks, RN Phone Number: 02/29/2024, 12:06 PM  Clinical Narrative:     This CM spoke with pt's sister introduced role and provided bed offer list listed below . She selected [facility] as 1st choice, 2cd choice [facility], and 3rd choice is [facility]. This CM confirmed bed offer with [facility] admission liaison and started ins auth. TOC will cont to follow dc planning / care coordination and update as applicable.   Expected Discharge Plan and Services                                               Social Drivers of Health (SDOH) Interventions SDOH Screenings   Food Insecurity: Patient Unable To Answer (02/26/2024)  Housing: Patient Unable To Answer (02/26/2024)  Transportation Needs: Patient Unable To Answer (02/26/2024)  Utilities: Patient Unable To Answer (02/26/2024)  Social Connections: Unknown (02/26/2024)  Tobacco Use: Medium Risk (02/25/2024)    Readmission Risk Interventions     No data to display

## 2024-02-29 NOTE — Progress Notes (Signed)
 Patient complained of itching skin and body aches, Tylenol  and skin lotion were given, form was changed, Patient reported feeling much better.   [place] ,[place]

## 2024-02-29 NOTE — Progress Notes (Signed)
 PROGRESS NOTE    Leslie Osborn  FMW:[id number] DOB: 11/19/1925 DOA: 02/25/2024 PCP: Valora Agent, MD   Assessment & Plan:   Active Problems:   * No active hospital problems. *  Assessment and Plan:  Acute intracerebral hemorrhage: continue w/ supportive care. Keep SBP 130-150. Continue w/ neuro checks   Acute metabolic encephalopathy: likely secondary to above. Hx of dementia. Continue w/ supportive care   HTN: continue on hydrochlorothiazide , amlodipine    HLD: continue on statin   PAF: rate controlled currently. Not on anticoagulation secondary to high fall risk  Thrombocytopenia: labile. Will continue to monitor   DVT prophylaxis: lovenox  Code Status: DNR Family Communication: discussed pt's care w/ pt's sister at bedside and answered her questions  Disposition Plan: likely d/c to SNF.  Level of care: Telemetry Medical  Status is: Inpatient Remains inpatient appropriate because: needs SNF placement    Consultants:  Neuro ICU   Procedures:   Antimicrobials:    Subjective: Pt is still pleasantly confused   Objective: Vitals:   02/28/24 1956 02/29/24 0003 02/29/24 0425 02/29/24 0753  BP: 117/88 139/71 132/70 135/70  Pulse: 64 71 66 (!) 59  Resp: 19 18 18 16   Temp: 99.2 F (37.3 C) 97.9 F (36.6 C) 98.3 F (36.8 C) 98.1 F (36.7 C)  TempSrc:   Oral   SpO2: 96% 95% 99% 95%  Weight:      Height:        Intake/Output Summary (Last 24 hours) at 02/29/2024 0900 Last data filed at 02/28/2024 1300 Gross per 24 hour  Intake 300 ml  Output --  Net 300 ml   Filed Weights   02/25/24 1015  Weight: 56.5 kg    Examination:  General exam: appears comfortable but confused Respiratory system: clear breath sounds b/l Cardiovascular system: S1/S2+. No rubs or clicks  Gastrointestinal system: avd is soft, NT, ND & hypoactive bowel sounds Central nervous system: alert & awake Psychiatry: Judgement and insight appears poor. Flat mood and affect    Data  Reviewed: I have personally reviewed following labs and imaging studies  CBC: Recent Labs  Lab 02/25/24 1025 02/26/24 0521 02/27/24 0209 02/28/24 0346  WBC 5.7 7.3 7.7 6.7  NEUTROABS 4.5  --   --   --   HGB 13.6 13.6 14.3 14.8  HCT 42.4 40.1 43.7 43.9  MCV 99.1 95.5 97.3 95.4  PLT 120* 160 160 146*   Basic Metabolic Panel: Recent Labs  Lab 02/25/24 1025 02/26/24 0521 02/27/24 0209 02/28/24 0346  NA 138 139 138 135  K 3.8 3.9 3.5 3.5  CL 99 100 97* 97*  CO2 26 28 28 27   GLUCOSE 100* 90 87 86  BUN 28* 28* 31* 36*  CREATININE 0.95 1.01* 1.17* 1.17*  CALCIUM 9.2 9.3 9.4 9.1  MG  --  1.9 1.8 2.7*  PHOS  --  3.5 3.0 3.4   GFR: Estimated Creatinine Clearance: 24.3 mL/min (A) (by C-G formula based on SCr of 1.17 mg/dL (H)). Liver Function Tests: Recent Labs  Lab 02/25/24 1025 02/26/24 0521 02/27/24 0209 02/28/24 0346  AST 29  --   --   --   ALT 11  --   --   --   ALKPHOS 53  --   --   --   BILITOT 0.8  --   --   --   PROT 6.7  --   --   --   ALBUMIN 3.7 3.6 3.8 3.7   No results for  input(s): LIPASE, AMYLASE in the last 168 hours. No results for input(s): AMMONIA in the last 168 hours. Coagulation Profile: Recent Labs  Lab 02/25/24 1025  INR 1.1   Cardiac Enzymes: Recent Labs  Lab 02/25/24 1025  CKTOTAL 57   BNP (last 3 results) No results for input(s): PROBNP in the last 8760 hours. HbA1C: No results for input(s): HGBA1C in the last 72 hours. CBG: Recent Labs  Lab 02/25/24 1620  GLUCAP 96   Lipid Profile: Recent Labs    02/27/24 0209  TRIG 42   Thyroid  Function Tests: Recent Labs    02/26/24 0938  TSH 3.187  T3FREE 1.6*   Anemia Panel: No results for input(s): VITAMINB12, FOLATE, FERRITIN, TIBC, IRON, RETICCTPCT in the last 72 hours. Sepsis Labs: No results for input(s): PROCALCITON, LATICACIDVEN in the last 168 hours.  Recent Results (from the past 240 hours)  MRSA Next Gen by PCR, Nasal     Status: None    Collection Time: 02/25/24  4:37 PM   Specimen: Nasal Mucosa; Nasal Swab  Result Value Ref Range Status   MRSA by PCR Next Gen NOT DETECTED NOT DETECTED Final    Comment: (NOTE) The GeneXpert MRSA Assay (FDA approved for NASAL specimens only), is one component of a comprehensive MRSA colonization surveillance program. It is not intended to diagnose MRSA infection nor to guide or monitor treatment for MRSA infections. Test performance is not FDA approved in patients less than 64 years old. Performed at [facility], [address], [place], [place] [address]          Radiology Studies: No results found.       Scheduled Meds:  amLODipine   2.5 mg Oral Daily   enoxaparin  (LOVENOX ) injection  30 mg Subcutaneous Q24H   escitalopram   5 mg Oral Daily   hydrochlorothiazide   12.5 mg Oral Daily   levothyroxine   75 mcg Oral QAC breakfast   melatonin  5 mg Oral QHS   pantoprazole   40 mg Oral QHS   senna-docusate  1 tablet Oral BID   simvastatin   20 mg Oral Daily   Continuous Infusions:   LOS: 4 days      Anthony CHRISTELLA Pouch, MD [facility] Pager [phone]  If 7PM-7AM, please contact night-coverage [email or web address] 02/29/2024, 9:00 AM

## 2024-03-01 DIAGNOSIS — S06369A Traumatic hemorrhage of cerebrum, unspecified, with loss of consciousness of unspecified duration, initial encounter: Secondary | ICD-10-CM | POA: Diagnosis not present

## 2024-03-01 MED ORDER — POLYETHYLENE GLYCOL 3350 17 G PO PACK
17.0000 g | PACK | Freq: Every day | ORAL | Status: DC
  Administered 2024-03-01: 17 g via ORAL
  Filled 2024-03-01: qty 1

## 2024-03-01 MED ORDER — ENSURE PLUS HIGH PROTEIN PO LIQD
237.0000 mL | Freq: Two times a day (BID) | ORAL | Status: DC
  Administered 2024-03-01 (×2): 237 mL via ORAL

## 2024-03-01 NOTE — Plan of Care (Signed)
  Problem: Intracerebral Hemorrhage Tissue Perfusion: Goal: Complications of Intracerebral Hemorrhage will be minimized Outcome: Progressing   Problem: Health Behavior/Discharge Planning: Goal: Ability to manage health-related needs will improve Outcome: Progressing   Problem: Health Behavior/Discharge Planning: Goal: Ability to manage health-related needs will improve Outcome: Progressing   Problem: Health Behavior/Discharge Planning: Goal: Ability to manage health-related needs will improve Outcome: Progressing

## 2024-03-01 NOTE — Plan of Care (Signed)

## 2024-03-01 NOTE — Plan of Care (Signed)
  Problem: Intracerebral Hemorrhage Tissue Perfusion: Goal: Complications of Intracerebral Hemorrhage will be minimized 03/01/2024 0548 by Pola Bridegroom, RN Outcome: Progressing 03/01/2024 0548 by Pola Bridegroom, RN Outcome: Progressing 03/01/2024 0329 by Pola Bridegroom, RN Outcome: Progressing 03/01/2024 0308 by Pola Bridegroom, RN Outcome: Progressing   Problem: Self-Care: Goal: Ability to communicate needs accurately will improve 03/01/2024 0548 by Pola Bridegroom, RN Outcome: Progressing 03/01/2024 0548 by Pola Bridegroom, RN Outcome: Progressing 03/01/2024 0329 by Pola Bridegroom, RN Outcome: Progressing 03/01/2024 0308 by Pola Bridegroom, RN Outcome: Progressing

## 2024-03-01 NOTE — Progress Notes (Signed)
 Physical Therapy Treatment Patient Details Name: Leslie Osborn MRN: [id number] DOB: Nov 29, 1925 Today's Date: 03/01/2024   History of Present Illness 89 y/o female presented to ED on 02/25/24 from [facility] [place] for unwitnessed fall with acute R sided hemiparesis. MRI showed acute parenchymal hematoma in posterior left frontal lobe. PMH: Alzheimer's dementia, Afib, HTN, hx of breast and colon cancer, macular degeneration    PT Comments  Patient agreeable to PT with encouragement. She continues to require significant assistance with mobility with right side weakness. She was able to complete pivot transfer to bed side commode today with +2 person assistance. Rehabilitation < 3 hours/day recommended after this hospital stay.    If plan is discharge home, recommend the following: Two people to help with walking and/or transfers;Two people to help with bathing/dressing/bathroom;Assistance with cooking/housework;Assistance with feeding;Direct supervision/assist for medications management;Direct supervision/assist for financial management;Assist for transportation;Supervision due to cognitive status   Can travel by private vehicle     No  Equipment Recommendations   (to  be determined)    Recommendations for Other Services       Precautions / Restrictions Precautions Precautions: Fall Recall of Precautions/Restrictions: Impaired Precaution/Restrictions Comments: 1:1 sitter Restrictions Weight Bearing Restrictions Per Provider Order: No     Mobility  Bed Mobility Overal bed mobility: Needs Assistance Bed Mobility: Supine to Sit, Sit to Supine     Supine to sit: Max assist Sit to supine: Max assist, +2 for physical assistance   General bed mobility comments: verbal cues for task initiation and sequencing    Transfers Overall transfer level: Needs assistance Equipment used: 2 person hand held assist Transfers: Bed to chair/wheelchair/BSC   Stand pivot transfers: Max assist, +2  physical assistance         General transfer comment: cues for technique and task initiation.    Ambulation/Gait               General Gait Details: unable to due to significant weakness and limited standing tolerance   Stairs             Wheelchair Mobility     Tilt Bed    Modified Rankin (Stroke Patients Only)       Balance Overall balance assessment: Needs assistance Sitting-balance support: No upper extremity supported, Feet supported, Single extremity supported Sitting balance-Leahy Scale: Poor Sitting balance - Comments: external support required Postural control: Right lateral lean Standing balance support: Bilateral upper extremity supported Standing balance-Leahy Scale: Poor Standing balance comment: +2 person assistance required                            Communication Communication Communication: No apparent difficulties  Cognition Arousal: Alert Behavior During Therapy: Agitated   PT - Cognitive impairments: History of cognitive impairments                       PT - Cognition Comments: patient agitated at times and needs encouragement. she is more cooperative as the session continued Following commands: Impaired Following commands impaired: Follows one step commands inconsistently, Follows one step commands with increased time (multi modal cues)    Cueing Cueing Techniques: Verbal cues, Gestural cues, Tactile cues, Visual cues  Exercises General Exercises - Lower Extremity Ankle Circles/Pumps: AAROM, Strengthening, Right, 5 reps, Seated (trace activation noted with cues) Long Arc Quad: AAROM, Strengthening, Right, Seated (x 3 reps. trace quad activation noted)    General Comments  Pertinent Vitals/Pain Pain Assessment Pain Assessment: Faces Faces Pain Scale: Hurts little more Pain Location: legs with movement Pain Descriptors / Indicators: Discomfort Pain Intervention(s): Limited activity within  patient's tolerance, Monitored during session, Repositioned    Home Living                          Prior Function            PT Goals (current goals can now be found in the care plan section) Acute Rehab PT Goals Patient Stated Goal: none stated PT Goal Formulation: With family Time For Goal Achievement: 03/12/24 Potential to Achieve Goals: Fair Progress towards PT goals: Progressing toward goals    Frequency    Min 3X/week      PT Plan      Co-evaluation PT/OT/SLP Co-Evaluation/Treatment: Yes Reason for Co-Treatment: Necessary to address cognition/behavior during functional activity;Complexity of the patient's impairments (multi-system involvement);For patient/therapist safety PT goals addressed during session: Mobility/safety with mobility OT goals addressed during session: ADL's and self-care      AM-PAC PT 6 Clicks Mobility   Outcome Measure  Help needed turning from your back to your side while in a flat bed without using bedrails?: A Lot Help needed moving from lying on your back to sitting on the side of a flat bed without using bedrails?: A Lot Help needed moving to and from a bed to a chair (including a wheelchair)?: Total Help needed standing up from a chair using your arms (e.g., wheelchair or bedside chair)?: Total Help needed to walk in hospital room?: Total Help needed climbing 3-5 steps with a railing? : Total 6 Click Score: 8    End of Session   Activity Tolerance: Patient tolerated treatment well Patient left: in bed;with call bell/phone within reach;with bed alarm set;with nursing/sitter in room Nurse Communication: Mobility status (discussed with sitter in the room) PT Visit Diagnosis: Muscle weakness (generalized) (M62.81);Other abnormalities of gait and mobility (R26.89);Other symptoms and signs involving the nervous system (R29.898)     Time: 9064-9041 PT Time Calculation (min) (ACUTE ONLY): 23 min  Charges:     $Therapeutic Activity: 8-22 mins PT General Charges $$ ACUTE PT VISIT: 1 Visit                     Randine Essex, PT, MPT    Randine LULLA Essex 03/01/2024, 10:45 AM

## 2024-03-01 NOTE — Progress Notes (Signed)
 PROGRESS NOTE    Leslie Osborn  FMW:[id number] DOB: [birth year] DOA: 02/25/2024 PCP: Valora Agent, MD   Assessment & Plan:   Active Problems:   * No active hospital problems. *  Assessment and Plan:  Acute intracerebral hemorrhage: continue w/ supportive care. Keep SBP 130-150.  Acute metabolic encephalopathy: likely secondary to above. Hx of dementia. Continue w/ supportive care   HTN: continue on amlodipine , HCTZ  HLD: continue on statin   PAF: rate controlled currently. Not on anticoagulation secondary to high fall risk  Thrombocytopenia: labile. Will continue to monitor   DVT prophylaxis: lovenox  Code Status: DNR Family Communication: discussed pt's care w/ pt's sister at bedside and answered her questions  Disposition Plan: likely d/c to SNF.  Level of care: Telemetry Medical  Status is: Inpatient Remains inpatient appropriate because: needs SNF placement    Consultants:  Neuro ICU   Procedures:   Antimicrobials:    Subjective: Pt is pleasantly confused   Objective: Vitals:   02/29/24 2009 03/01/24 0004 03/01/24 0400 03/01/24 0737  BP: 119/77 (!) 148/76 103/61 (!) 162/77  Pulse: (!) 59 69 91 (!) 54  Resp: 18 18 19 18   Temp: 98.1 F (36.7 C) 98.6 F (37 C) 98.5 F (36.9 C) 97.6 F (36.4 C)  TempSrc:   Oral Oral  SpO2: 95% 95% 97% 96%  Weight:      Height:        Intake/Output Summary (Last 24 hours) at 03/01/2024 0852 Last data filed at 02/29/2024 1821 Gross per 24 hour  Intake 0 ml  Output --  Net 0 ml   Filed Weights   02/25/24 1015  Weight: 56.5 kg    Examination:  General exam: appears calm but confused Respiratory system: clear breath sounds b/l  Cardiovascular system: S1/S2+.   Gastrointestinal system: abd is soft, NT, ND & hypoactive bowel sounds  Central nervous system: alert & awake Psychiatry: judgement and insight appears poor. Flat mood and affect     Data Reviewed: I have personally reviewed following labs and  imaging studies  CBC: Recent Labs  Lab 02/25/24 1025 02/26/24 0521 02/27/24 0209 02/28/24 0346  WBC 5.7 7.3 7.7 6.7  NEUTROABS 4.5  --   --   --   HGB 13.6 13.6 14.3 14.8  HCT 42.4 40.1 43.7 43.9  MCV 99.1 95.5 97.3 95.4  PLT 120* 160 160 146*   Basic Metabolic Panel: Recent Labs  Lab 02/25/24 1025 02/26/24 0521 02/27/24 0209 02/28/24 0346  NA 138 139 138 135  K 3.8 3.9 3.5 3.5  CL 99 100 97* 97*  CO2 26 28 28 27   GLUCOSE 100* 90 87 86  BUN 28* 28* 31* 36*  CREATININE 0.95 1.01* 1.17* 1.17*  CALCIUM 9.2 9.3 9.4 9.1  MG  --  1.9 1.8 2.7*  PHOS  --  3.5 3.0 3.4   GFR: Estimated Creatinine Clearance: 24.3 mL/min (A) (by C-G formula based on SCr of 1.17 mg/dL (H)). Liver Function Tests: Recent Labs  Lab 02/25/24 1025 02/26/24 0521 02/27/24 0209 02/28/24 0346  AST 29  --   --   --   ALT 11  --   --   --   ALKPHOS 53  --   --   --   BILITOT 0.8  --   --   --   PROT 6.7  --   --   --   ALBUMIN 3.7 3.6 3.8 3.7   No results for input(s): LIPASE, AMYLASE in  the last 168 hours. No results for input(s): AMMONIA in the last 168 hours. Coagulation Profile: Recent Labs  Lab 02/25/24 1025  INR 1.1   Cardiac Enzymes: Recent Labs  Lab 02/25/24 1025  CKTOTAL 57   BNP (last 3 results) No results for input(s): PROBNP in the last 8760 hours. HbA1C: No results for input(s): HGBA1C in the last 72 hours. CBG: Recent Labs  Lab 02/25/24 1620  GLUCAP 96   Lipid Profile: No results for input(s): CHOL, HDL, LDLCALC, TRIG, CHOLHDL, LDLDIRECT in the last 72 hours.  Thyroid  Function Tests: No results for input(s): TSH, T4TOTAL, FREET4, T3FREE, THYROIDAB in the last 72 hours.  Anemia Panel: No results for input(s): VITAMINB12, FOLATE, FERRITIN, TIBC, IRON, RETICCTPCT in the last 72 hours. Sepsis Labs: No results for input(s): PROCALCITON, LATICACIDVEN in the last 168 hours.  Recent Results (from the past 240 hours)   MRSA Next Gen by PCR, Nasal     Status: None   Collection Time: 02/25/24  4:37 PM   Specimen: Nasal Mucosa; Nasal Swab  Result Value Ref Range Status   MRSA by PCR Next Gen NOT DETECTED NOT DETECTED Final    Comment: (NOTE) The GeneXpert MRSA Assay (FDA approved for NASAL specimens only), is one component of a comprehensive MRSA colonization surveillance program. It is not intended to diagnose MRSA infection nor to guide or monitor treatment for MRSA infections. Test performance is not FDA approved in patients less than 36 years old. Performed at [facility], [address], [place], [place] [address]          Radiology Studies: No results found.       Scheduled Meds:  amLODipine   2.5 mg Oral Daily   enoxaparin  (LOVENOX ) injection  30 mg Subcutaneous Q24H   escitalopram   5 mg Oral Daily   feeding supplement  237 mL Oral BID BM   hydrochlorothiazide   12.5 mg Oral Daily   levothyroxine   75 mcg Oral QAC breakfast   melatonin  5 mg Oral QHS   pantoprazole   40 mg Oral QHS   senna-docusate  1 tablet Oral BID   simvastatin   20 mg Oral Daily   Continuous Infusions:   LOS: 5 days      Anthony CHRISTELLA Pouch, MD [facility] Pager [phone]  If 7PM-7AM, please contact night-coverage [email or web address] 03/01/2024, 8:52 AM

## 2024-03-01 NOTE — Progress Notes (Signed)
 Occupational Therapy Treatment Patient Details Name: Leslie Osborn MRN: [id number] DOB: [birth year] Today's Date: 03/01/2024   History of present illness 89 y/o female presented to ED on 02/25/24 from [facility] [facility] [place] for unwitnessed fall with acute R sided hemiparesis. MRI showed acute parenchymal hematoma in posterior left frontal lobe. PMH: Alzheimer's dementia, Afib, HTN, hx of breast and colon cancer, macular degeneration   OT comments  Chart reviewed to date, pt greeted semi supine in bed, oriented to self only. Initially agitated, but improved with mobility attempts. Pt continues to require MAX A +1-2 for bed mobility, stand pivot transfer to bedside chair with MAX A +2. Fair-poor sitting balance sitting on edge of bed for approx 5 minutes with R lateral lean noted (corrected by therapist sitting on Pt right side and guarding with varied assist levels MIN-MAX A). Noted inattention to the R on this date but pt will turn her head with cueing. Pt is making progress towards goals, discharge recommendation remains appropriate. OT will continue to follow.       If plan is discharge home, recommend the following:  Two people to help with walking and/or transfers;A lot of help with bathing/dressing/bathroom;Assistance with cooking/housework;Assist for transportation;Help with stairs or ramp for entrance;Direct supervision/assist for medications management;Direct supervision/assist for financial management;Supervision due to cognitive status   Equipment Recommendations  Other (comment) (defer)    Recommendations for Other Services      Precautions / Restrictions Precautions Precautions: Fall Recall of Precautions/Restrictions: Impaired Precaution/Restrictions Comments: 1:1 sitter Restrictions Weight Bearing Restrictions Per Provider Order: No       Mobility Bed Mobility Overal bed mobility: Needs Assistance Bed Mobility: Supine to Sit, Sit to Supine     Supine to sit: Max assist  (+1-2) Sit to supine: Max assist (+1-2)        Transfers Overall transfer level: Needs assistance Equipment used: 2 person hand held assist Transfers: Bed to chair/wheelchair/BSC Sit to Stand: Max assist, +2 physical assistance, +2 safety/equipment Stand pivot transfers: Max assist, +2 physical assistance, +2 safety/equipment         General transfer comment: B knees blocked throughout     Balance Overall balance assessment: Needs assistance Sitting-balance support: No upper extremity supported, Feet supported, Single extremity supported Sitting balance-Leahy Scale: Fair Sitting balance - Comments: fair, progress to poor with fatigue, sits on edge of bed for approx 5 minutes Postural control: Right lateral lean, Posterior lean Standing balance support: Single extremity supported, During functional activity Standing balance-Leahy Scale: Zero Standing balance comment: poor-zero                           ADL either performed or assessed with clinical judgement   ADL Overall ADL's : Needs assistance/impaired Eating/Feeding: Minimal assistance;Sitting;Cueing for sequencing Eating/Feeding Details (indicate cue type and reason): drink ensure at edge of bed with L hand         Lower Body Bathing: Maximal assistance;Total assistance;Sit to/from stand       Lower Body Dressing: Minimal assistance   Toilet Transfer: Maximal assistance;+2 for physical assistance;+2 for safety/equipment;Stand-pivot;BSC/3in1;Cueing for sequencing;Cueing for safety Toilet Transfer Details (indicate cue type and reason): therapist supporting R shoulder in neutral Toileting- Clothing Manipulation and Hygiene: Maximal assistance;Sit to/from stand Toileting - Clothing Manipulation Details (indicate cue type and reason): unable to urinate on toilet            Extremity/Trunk Assessment Upper Extremity Assessment RUE Deficits / Details: >1 shoulder subluxation noted,  able to open/close R  hand with moderately weak grip strength (improvement) RUE: Subluxation noted            Vision   Additional Comments: ?visual field loss to the R side, will continue to assess   Perception Perception Perception: Impaired Preception Impairment Details: Inattention/Neglect   Praxis     Communication Communication Communication: No apparent difficulties   Cognition Arousal: Alert Behavior During Therapy: Agitated Cognition: Cognition impaired   Orientation impairments: Place, Time, Situation Awareness: Intellectual awareness impaired, Online awareness impaired Memory impairment (select all impairments): Short-term memory, Working [employer], Engineer, structural memory Attention impairment (select first level of impairment): Focused attention Executive functioning impairment (select all impairments): Initiation, Organization, Sequencing, Reasoning, Problem solving                   Following commands: Impaired Following commands impaired: Follows one step commands inconsistently, Follows one step commands with increased time (and step by step multi modal cues)      Cueing   Cueing Techniques: Verbal cues, Gestural cues, Tactile cues, Visual cues  Exercises Other Exercises Other Exercises: edu re role of OT, role of rehab    Shoulder Instructions       General Comments      Pertinent Vitals/ Pain       Pain Assessment Pain Assessment: PAINAD Breathing: normal Negative Vocalization: none Facial Expression: sad, frightened, frown Body Language: tense, distressed pacing, fidgeting Consolability: distracted or reassured by voice/touch PAINAD Score: 3 Pain Location: legs with movement Pain Descriptors / Indicators: Discomfort Pain Intervention(s): Monitored during session, Repositioned  Home Living                                          Prior Functioning/Environment              Frequency  Min 3X/week        Progress Toward  Goals  OT Goals(current goals can now be found in the care plan section)  Progress towards OT goals: Progressing toward goals  Acute Rehab OT Goals Time For Goal Achievement: 03/12/24  Plan      Co-evaluation    PT/OT/SLP Co-Evaluation/Treatment: Yes Reason for Co-Treatment: Necessary to address cognition/behavior during functional activity;Complexity of the patient's impairments (multi-system involvement);For patient/therapist safety PT goals addressed during session: Mobility/safety with mobility OT goals addressed during session: ADL's and self-care      AM-PAC OT 6 Clicks Daily Activity     Outcome Measure   Help from another person eating meals?: A Little Help from another person taking care of personal grooming?: A Little Help from another person toileting, which includes using toliet, bedpan, or urinal?: Total Help from another person bathing (including washing, rinsing, drying)?: A Lot Help from another person to put on and taking off regular upper body clothing?: A Lot Help from another person to put on and taking off regular lower body clothing?: A Lot 6 Click Score: 13    End of Session    OT Visit Diagnosis: Other abnormalities of gait and mobility (R26.89);Hemiplegia and hemiparesis;Pain Hemiplegia - Right/Left: Right Hemiplegia - dominant/non-dominant: Dominant   Activity Tolerance Patient tolerated treatment well   Patient Left in bed;with bed alarm set;with call bell/phone within reach;with nursing/sitter in room   Nurse Communication Mobility status (NT/sitter re mobility status, supporting RUE in bed)        Time: 9064-9040 OT  Time Calculation (min): 24 min  Charges: OT General Charges $OT Visit: 1 Visit OT Treatments $Self Care/Home Management : 8-22 mins  Therisa Sheffield, OTD OTR/L  03/01/24, 10:48 AM

## 2024-03-02 DIAGNOSIS — S06369A Traumatic hemorrhage of cerebrum, unspecified, with loss of consciousness of unspecified duration, initial encounter: Secondary | ICD-10-CM | POA: Diagnosis not present

## 2024-03-02 LAB — CBC
HCT: 45.1 % (ref 36.0–46.0)
Hemoglobin: 14.5 g/dL (ref 12.0–15.0)
MCH: 31.4 pg (ref 26.0–34.0)
MCHC: 32.2 g/dL (ref 30.0–36.0)
MCV: 97.6 fL (ref 80.0–100.0)
Platelets: 166 K/uL (ref 150–400)
RBC: 4.62 MIL/uL (ref 3.87–5.11)
RDW: 14.1 % (ref 11.5–15.5)
WBC: 5.7 K/uL (ref 4.0–10.5)
nRBC: 0 % (ref 0.0–0.2)

## 2024-03-02 LAB — BASIC METABOLIC PANEL WITH GFR
Anion gap: 13 (ref 5–15)
BUN: 47 mg/dL — ABNORMAL HIGH (ref 8–23)
CO2: 28 mmol/L (ref 22–32)
Calcium: 9.6 mg/dL (ref 8.9–10.3)
Chloride: 95 mmol/L — ABNORMAL LOW (ref 98–111)
Creatinine, Ser: 1.12 mg/dL — ABNORMAL HIGH (ref 0.44–1.00)
GFR, Estimated: 45 mL/min — ABNORMAL LOW (ref >60)
Glucose, Bld: 95 mg/dL (ref 70–99)
Potassium: 3.8 mmol/L (ref 3.5–5.1)
Sodium: 136 mmol/L (ref 135–145)

## 2024-03-02 NOTE — TOC PASRR Note (Signed)
 The above named patient is recommended to go to Short Term Rehab for strengthening and gait training for balance.  It is expected that the Short Term Rehab stay will be less than 30 days.  The patient is expected to return home after Rehab.

## 2024-03-02 NOTE — TOC Transition Note (Signed)
 Transition of Care [facility]) - Discharge Note   Patient Details  Name: Leslie Osborn MRN: [id number] Date of Birth: Oct 11, 1925  [facility] of [facility] CM/SW Contact:  Alfonso Rummer, LCSW Phone Number: 03/02/2024, 2:17 PM   Clinical Narrative:   [facility]. No further TOC needs.           Patient Goals and CMS Choice            Discharge Placement                       Discharge Plan and Services Additional resources added to the After Visit Summary for                                       Social Drivers of Health (SDOH) Interventions SDOH Screenings   Food Insecurity: Patient Unable To Answer (02/26/2024)  Housing: Patient Unable To Answer (02/26/2024)  Transportation Needs: Patient Unable To Answer (02/26/2024)  Utilities: Patient Unable To Answer (02/26/2024)  Social Connections: Unknown (02/26/2024)  Tobacco Use: Medium Risk (02/25/2024)     Readmission Risk Interventions     No data to display

## 2024-03-02 NOTE — Plan of Care (Signed)
 The patient continues to have a 1:1 sitter. The patient c/o Right leg pain 5/10. Tylenol  PRN given x 2 for pain with minimal relief. No s/s of acute distress noted.

## 2024-03-02 NOTE — Discharge Summary (Signed)
 Physician Discharge Summary  Leslie Osborn FMW:[id number] DOB: 01-30-1926 DOA: 02/25/2024  PCP: Valora Agent, MD  Admit date: 02/25/2024 Discharge date: 03/02/2024  Admitted From: home Disposition:  SNF  Recommendations for Outpatient Follow-up:  Follow up with PCP in 1-2 weeks F/u w/ neuro, Dr. Lane, in 4-6 weeks F/u w/ cardio w/ previously scheduled appointment   Home Health: no  Equipment/Devices:  Discharge Condition: stable  CODE STATUS: DNR Diet recommendation: dysphagia II diet   Brief/Interim Summary: HPI was taken from NP Shellia: Leslie Osborn is a 89 y.o. female with PMHx significant for Atrial Fibrillation not on anticoagulation due to fall risk and dementia, presenting to [facility] ED on 02/25/24 from [facility] status post unwitnessed fall and acute onset of right sided hemiparesis.  Her last known well time was approximately 14:00 yesterday.   In the ED there was concern for possible stroke which was discussed with Neurology.  She was outside the window for TNK and given her age and dementia history, she was deemed not to be a potential candidate for thrombectomy.   ED Course: Initial Vital Signs: Temperature 97.5 F, pulse 50, RR 16, BP 194/84, SpO2 96% on room air Significant Labs: platelets 120, glucose 100 Imaging Chest X-ray>>IMPRESSION: No active disease. Aortic Atherosclerosis X-ray Pelvis>>FINDINGS: There is no evidence of pelvic fracture or diastasis. No pelvic bone lesions are seen. CT Cervical Spine>>IMPRESSION: Degenerative change in the cervical spine without acute fracture or subluxation. CT angio Head & Neck>>IMPRESSION: 1. Acute parenchymal hematoma in the posterior left frontal lobe with mild surrounding edema and local mass effect. No midline shift. 2. No large vessel occlusion. 3. Calcified atherosclerosis at the left carotid bifurcation resulting in approximately 60% stenosis at the origin of the left cervical ICA. Medications  Administered: Cleviprex  infusion initiated    PCCM asked to admit for further workup and treatment.  Neurology consulted, ok to admit to [facility] ICU with strict BP control.  As per NP Maranda: 9/2: Presented to ED following fall and right sided weakness.  Found to have small ICH, evaluated by Neurology, ok to admit to [facility] ICU with strict BP control utilizing Cleviprex .  9/3: Pt remains on Cleviprex  gtt @2mg /hr to maintain sbp 130-150.   9/4: Pt no longer requiring Cleviprex  gtt to maintain sbp 130-150.  Pending transfer to the telemetry unit.  Will transfer service to [facility] to assume care on 09/05   Discharge Diagnoses:  Active Problems:   * No active hospital problems. *  Acute intracerebral hemorrhage: continue w/ supportive care.  Acute metabolic encephalopathy: likely secondary to above. Hx of dementia. Mental status close to baseline. Continue w/ supportive care   HTN: continue on amlodipine , HCTZ  HLD: continue on statin   PAF: rate controlled currently. Not on anticoagulation secondary to high fall risk  Thrombocytopenia: resolved   Discharge Instructions  Discharge Instructions     Discharge instructions   Complete by: As directed    F/u w/ PCP in 1-2 weeks. F/u w/ cardio at previously scheduled appointment. F/u w/ neuro, Dr. Lane, in 4-6 weeks.   Increase activity slowly   Complete by: As directed       Allergies as of 03/02/2024       Reactions   Donepezil Hcl Nausea And Vomiting, Diarrhea        Medication List     PAUSE taking these medications    metoprolol  tartrate 25 MG tablet Wait to take this until: June 24, 2024 on hold  until 06/24/2024 after Cardiology follow up Commonly known as: LOPRESSOR  Take 0.5 tablets (12.5 mg total) by mouth 2 (two) times daily.       TAKE these medications    acetaminophen  500 MG tablet Commonly known as: TYLENOL  Take 1,000 mg by mouth every 6 (six) hours as needed for mild pain (pain score 1-3).   amLODipine   2.5 MG tablet Commonly known as: NORVASC  Take 2.5 mg by mouth daily.   escitalopram  5 MG tablet Commonly known as: LEXAPRO  Take 5 mg by mouth daily.   galantamine  12 MG tablet Commonly known as: RAZADYNE  Take 12 mg by mouth 2 (two) times daily.   hydrochlorothiazide  12.5 MG tablet Commonly known as: HYDRODIURIL  Take 12.5 mg by mouth daily.   levothyroxine  75 MCG tablet Commonly known as: SYNTHROID  Take 1 tablet (75 mcg total) by mouth daily before breakfast.   lisinopril  40 MG tablet Commonly known as: ZESTRIL  Take 40 mg by mouth in the morning and at bedtime.   melatonin 3 MG Tabs tablet Take 3 mg by mouth at bedtime.   Ocutabs Tabs Take 1 tablet by mouth daily.   simvastatin  20 MG tablet Commonly known as: ZOCOR  Take 20 mg by mouth daily.   trolamine salicylate 10 % cream Commonly known as: ASPERCREME Apply 1 application. topically 2 (two) times daily as needed for muscle pain.        Allergies  Allergen Reactions   Donepezil Hcl Nausea And Vomiting and Diarrhea    Consultations: ICU Neuro    Procedures/Studies: MR BRAIN WO CONTRAST Result Date: 02/26/2024 EXAM: MRI BRAIN WITHOUT CONTRAST 02/26/2024 12:35:22 PM TECHNIQUE: Multiplanar multisequence MRI of the head/brain was performed without the administration of intravenous contrast. COMPARISON: CTA head and neck 02/25/2024 CLINICAL HISTORY: Stroke, follow up. 89 y.o. female with history of A-fib who comes in with concerns for fall. I reviewed a note from 11/15/2023 where patient was seen by cardiology for her A-fib. She is on metoprolol . It looks like she is on 12.5 twice daily. She is not on any blood thinners due to high risk of falling. Attempted calling Ms Benjamine- no answer. Tried calling Tom bland no answer. Per family she was normal at 2pm. Patient's sister is now at bedside he does report that she has never had the right sided weakness in the past. She reports at 2 PM yesterday she seemed to be normal but  unclear if anyone saw her normal after that. FINDINGS: BRAIN AND VENTRICLES: Prominent susceptibility and signal abnormality within the posterior left frontal lobe involving the left precentral gyrus compatible with acute parenchymal hematoma. There is surrounding edema and mild local mass effect. No midline shift. There are nonspecific hyperintense foci in the subcortical and periventricular white matter that most likely represent chronic microangiopathic ischemic changes in a patient of this age. Mild parenchymal volume loss. There are punctate foci of susceptibility within the pons which may be related to hypertension. ORBITS: Bilateral lens replacement. SINUSES AND MASTOIDS: No acute abnormality. BONES AND SOFT TISSUES: Normal marrow signal. No acute soft tissue abnormality. IMPRESSION: 1. Acute parenchymal hematoma in the posterior left frontal lobe involving the left precentral gyrus. Mild surrounding edema and local mass effect. No midline shift. 2. Punctate foci of susceptibility within the pons, possibly related to hypertension. 3. Chronic microangiopathic ischemic changes. 4. Mild parenchymal volume loss. Electronically signed by: Donnice Mania MD 02/26/2024 01:48 PM EDT RP Workstation: [id number]   ECHOCARDIOGRAM COMPLETE Result Date: 02/26/2024    ECHOCARDIOGRAM REPORT   Patient  Name:   SAMAMTHA TIEGS Date of Exam: 02/26/2024 Medical Rec #:  [id number]      Height:       64.0 in Accession #:    [id number]     Weight:       124.5 lb Date of Birth:  1925-12-22      BSA:          1.599 m Patient Age:    89 years       BP:           154/81 mmHg Patient Gender: F              HR:           61 bpm. Exam Location:  [facility] Procedure: 2D Echo, Cardiac Doppler and Color Doppler (Both Spectral and Color            Flow Doppler were utilized during procedure).          REPORT CONTAINS CRITICAL RESULT Reported to: Timothy Gollan on 02/26/2024 9:13:00 AM              Severe Aortic Stenosis. Indications:     Stroke I63.9   History:         Patient has no prior history of Echocardiogram examinations.                  Stroke.  Sonographer:     Ashley McNeely-Sloane Referring Phys:  [id number] INGE JONETTA LECHER Diagnosing Phys: Evalene Lunger MD  Sonographer Comments: Altered mental status. IMPRESSIONS  1. Left ventricular ejection fraction, by estimation, is 60 to 65%. The left ventricle has normal function. The left ventricle has no regional wall motion abnormalities. There is moderate left ventricular hypertrophy. Left ventricular diastolic parameters are indeterminate.  2. Right ventricular systolic function is normal. The right ventricular size is normal.  3. The mitral valve is normal in structure. Mild mitral valve regurgitation. No evidence of mitral stenosis. Severe mitral annular calcification.  4. The aortic valve is calcified. Aortic valve regurgitation is not visualized. Severe aortic valve stenosis. Aortic valve area, by VTI measures 0.58 cm. Aortic valve mean gradient measures 36.7 mmHg. Aortic valve Vmax measures 3.99 m/s.  5. The inferior vena cava is normal in size with greater than 50% respiratory variability, suggesting right atrial pressure of 3 mmHg. FINDINGS  Left Ventricle: Left ventricular ejection fraction, by estimation, is 60 to 65%. The left ventricle has normal function. The left ventricle has no regional wall motion abnormalities. Strain was performed and the global longitudinal strain is indeterminate. The left ventricular internal cavity size was normal in size. There is moderate left ventricular hypertrophy. Left ventricular diastolic parameters are indeterminate. Right Ventricle: The right ventricular size is normal. No increase in right ventricular wall thickness. Right ventricular systolic function is normal. Left Atrium: Left atrial size was normal in size. Right Atrium: Right atrial size was normal in size. Pericardium: There is no evidence of pericardial effusion. Mitral Valve: The mitral valve is  normal in structure. There is mild calcification of the mitral valve leaflet(s). Severe mitral annular calcification. Mild mitral valve regurgitation. No evidence of mitral valve stenosis. MV peak gradient, 7.8 mmHg. The  mean mitral valve gradient is 2.0 mmHg. Tricuspid Valve: The tricuspid valve is normal in structure. Tricuspid valve regurgitation is not demonstrated. No evidence of tricuspid stenosis. Aortic Valve: The aortic valve is calcified. Aortic valve regurgitation is not visualized. Severe aortic stenosis is present. Aortic valve mean gradient measures  36.7 mmHg. Aortic valve peak gradient measures 63.8 mmHg. Aortic valve area, by VTI measures  0.58 cm. Pulmonic Valve: The pulmonic valve was normal in structure. Pulmonic valve regurgitation is not visualized. No evidence of pulmonic stenosis. Aorta: The aortic root is normal in size and structure. Venous: The inferior vena cava is normal in size with greater than 50% respiratory variability, suggesting right atrial pressure of 3 mmHg. IAS/Shunts: No atrial level shunt detected by color flow Doppler. Additional Comments: 3D was performed not requiring image post processing on an independent workstation and was indeterminate.  LEFT VENTRICLE PLAX 2D LVIDd:         4.10 cm     Diastology LVIDs:         2.30 cm     LV e' medial:    5.18 cm/s LV PW:         1.40 cm     LV E/e' medial:  19.5 LV IVS:        1.30 cm     LV e' lateral:   6.00 cm/s LVOT diam:     1.80 cm     LV E/e' lateral: 16.8 LV SV:         51 LV SV Index:   32 LVOT Area:     2.54 cm  LV Volumes (MOD) LV vol d, MOD A2C: 40.9 ml LV vol d, MOD A4C: 25.6 ml LV vol s, MOD A2C: 22.3 ml LV vol s, MOD A4C: 11.2 ml LV SV MOD A2C:     18.6 ml LV SV MOD A4C:     25.6 ml LV SV MOD BP:      16.7 ml RIGHT VENTRICLE             IVC RV S prime:     10.70 cm/s  IVC diam: 1.65 cm TAPSE (M-mode): 1.3 cm LEFT ATRIUM             Index        RIGHT ATRIUM           Index LA diam:        4.30 cm 2.69 cm/m   RA  Area:     25.60 cm LA Vol (A2C):   96.7 ml 60.46 ml/m  RA Volume:   84.20 ml  52.64 ml/m LA Vol (A4C):   62.0 ml 38.76 ml/m LA Biplane Vol: 82.1 ml 51.33 ml/m  AORTIC VALVE                     PULMONIC VALVE AV Area (Vmax):    0.55 cm      PV Vmax:        1.28 m/s AV Area (Vmean):   0.51 cm      PV Vmean:       85.600 cm/s AV Area (VTI):     0.58 cm      PV VTI:         0.264 m AV Vmax:           399.33 cm/s   PV Peak grad:   6.6 mmHg AV Vmean:          284.667 cm/s  PV Mean grad:   3.0 mmHg AV VTI:            0.883 m       RVOT Peak grad: 5 mmHg AV Peak Grad:      63.8 mmHg AV Mean Grad:  36.7 mmHg LVOT Vmax:         85.90 cm/s LVOT Vmean:        57.600 cm/s LVOT VTI:          0.200 m LVOT/AV VTI ratio: 0.23  AORTA Ao Root diam: 3.00 cm Ao Asc diam:  2.70 cm MITRAL VALVE                TRICUSPID VALVE MV Area (PHT): 3.42 cm     TR Peak grad:   37.2 mmHg MV Area VTI:   1.11 cm     TR Mean grad:   26.0 mmHg MV Peak grad:  7.8 mmHg     TR Vmax:        305.00 cm/s MV Mean grad:  2.0 mmHg     TR Vmean:       246.0 cm/s MV Vmax:       1.40 m/s MV Vmean:      55.8 cm/s    SHUNTS MV Decel Time: 222 msec     Systemic VTI:  0.20 m MV E velocity: 101.00 cm/s  Systemic Diam: 1.80 cm MV A velocity: 34.10 cm/s   Pulmonic VTI:  0.256 m MV E/A ratio:  2.96 Evalene Lunger MD Electronically signed by Evalene Lunger MD Signature Date/Time: 02/26/2024/1:39:48 PM    Final    CT C-SPINE NO CHARGE Result Date: 02/25/2024 CLINICAL DATA:  Unwitnessed fall. EXAM: CT CERVICAL SPINE WITHOUT CONTRAST TECHNIQUE: Multidetector CT imaging of the cervical spine was performed without intravenous contrast. Multiplanar CT image reconstructions were also generated. RADIATION DOSE REDUCTION: This exam was performed according to the departmental dose-optimization program which includes automated exposure control, adjustment of the mA and/or kV according to patient size and/or use of iterative reconstruction technique. COMPARISON:   07/21/2023 FINDINGS: Alignment: Broad-based dextroscoliotic curvature. Straightening of normal lordosis. No traumatic subluxation. Skull base and vertebrae: No acute fracture. Vertebral body heights are maintained. The dens and skull base are intact. Soft tissues and spinal canal: Fully assessed on neck CTA. No prevertebral soft tissue thickening. No canal hematoma. Disc levels:  M unchanged ultilevel degenerative disc disease. Upper chest: Emphysema.  No acute findings. Other: None. IMPRESSION: Degenerative change in the cervical spine without acute fracture or subluxation. Electronically Signed   By: Andrea Gasman M.D.   On: 02/25/2024 16:12   CT ANGIO HEAD NECK W WO CM Result Date: 02/25/2024 EXAM: CTA Head and Neck with Intravenous Contrast. CT Head without Contrast. CLINICAL HISTORY: Neuro deficit, acute, stroke suspected. Pt to ED via [facility] from the [facility] for c/o unwitnessed fall. LKW 1400 yesterday. Pt found in floor by bed this morning around 900. Pt unable to use right arm or leg, per EMS unsure if this is baseline. Pt not on blood thinners. Pt has hx; dementia, disoriented x4. TECHNIQUE: Axial CTA images of the head and neck performed with intravenous contrast. MIP reconstructed images were created and reviewed. Axial computed tomography images of the head/brain performed without intravenous contrast. Note: Per PQRS, the description of internal carotid artery percent stenosis, including 0 percent or normal exam, is based on [place] American Symptomatic Carotid Endarterectomy Trial (NASCET) criteria. Dose reduction technique was used including one or more of the following: automated exposure control, adjustment of mA and kV according to patient size, and/or iterative reconstruction. CONTRAST: With; 75 mL of iohexol  (OMNIPAQUE ) 350 MG/ML injection. COMPARISON: CT head from 07/21/2023. FINDINGS: CT HEAD: BRAIN: Acute parenchymal hematoma in the posterior left frontal lobe  which measures 2.1 x  2.3 x 3.0 cm. There is mild surrounding edema and local mass effect, but no midline shift. No evidence of extraaxial hemorrhage or intraventricular hemorrhage. Mild parenchymal volume loss. Nonspecific hypoattenuation in the periventricular and subcortical white matter, most likely representing chronic small vessel disease. VENTRICLES: No hydrocephalus. ORBITS: Emphysema within the lung apices. SINUSES AND MASTOIDS: The paranasal sinuses and mastoid air cells are clear. CTA NECK: COMMON CAROTID ARTERIES: Tortuosity of the proximal left common carotid artery. No significant stenosis by NASCET criteria. No dissection or occlusion. INTERNAL CAROTID ARTERIES: Calcified atherosclerosis at the right carotid bifurcation without hemodynamically significant stenosis. Tortuosity of the mid right cervical ICA. Calcified atherosclerosis at the left carotid bifurcation resulting in approximately 60% stenosis at the origin of the left cervical ICA. Tortuosity of the mid left cervical ICA. No dissection or occlusion. VERTEBRAL ARTERIES: The right vertebral artery is dominant. Atherosclerosis of the right V1 segment. The extracranial vertebral arteries are patent. No significant stenosis by NASCET criteria. No dissection or occlusion. CTA HEAD: ANTERIOR CEREBRAL ARTERIES: The A1 segment of the left ACA is not visualized and is likely congenitally hypoplastic. The ACAs are otherwise patent bilaterally. No aneurysm. MIDDLE CEREBRAL ARTERIES: The middle cerebral arteries are patent bilaterally. No aneurysm. POSTERIOR CEREBRAL ARTERIES: No significant stenosis. No occlusion. No aneurysm. BASILAR ARTERY: No significant stenosis. No occlusion. No aneurysm. OTHER: Moderate atherosclerosis of the aortic arch. SOFT TISSUES: No acute finding. No masses or lymphadenopathy. BONES: Degenerative changes in the visualized spine, greatest at C5-6. IMPRESSION: 1. Acute parenchymal hematoma in the posterior left frontal lobe with mild surrounding  edema and local mass effect. No midline shift. 2. No large vessel occlusion. 3. Calcified atherosclerosis at the left carotid bifurcation resulting in approximately 60% stenosis at the origin of the left cervical ICA. Electronically signed by: Donnice Mania MD 02/25/2024 04:01 PM EDT RP Workstation: [id number]   DG Chest Portable 1 View Result Date: 02/25/2024 CLINICAL DATA:  Chest pain after fall. EXAM: PORTABLE CHEST 1 VIEW COMPARISON:  December 15, 2023. FINDINGS: Stable cardiomegaly. Both lungs are clear. The visualized skeletal structures are unremarkable. IMPRESSION: No active disease. Aortic Atherosclerosis (ICD10-I70.0). Electronically Signed   By: Lynwood Landy Raddle M.D.   On: 02/25/2024 11:07   DG Pelvis Portable Result Date: 02/25/2024 CLINICAL DATA:  Pelvic pain after fall. EXAM: PORTABLE PELVIS 1-2 VIEWS COMPARISON:  None Available. FINDINGS: There is no evidence of pelvic fracture or diastasis. No pelvic bone lesions are seen. IMPRESSION: Negative. Electronically Signed   By: Lynwood Landy Raddle M.D.   On: 02/25/2024 11:06   (Echo, Carotid, EGD, Colonoscopy, ERCP)    Subjective: Pt denies any pain    Discharge Exam: Vitals:   03/02/24 0855 03/02/24 1207  BP: 138/61 (!) 114/59  Pulse: (!) 54 (!) 50  Resp: 17 18  Temp: (!) 97.5 F (36.4 C) 97.7 F (36.5 C)  SpO2: 97% 95%   Vitals:   03/01/24 2017 03/02/24 0539 03/02/24 0855 03/02/24 1207  BP: 137/79 129/81 138/61 (!) 114/59  Pulse: (!) 57 68 (!) 54 (!) 50  Resp: 16 16 17 18   Temp: 97.6 F (36.4 C) 98.3 F (36.8 C) (!) 97.5 F (36.4 C) 97.7 F (36.5 C)  TempSrc:   Oral Axillary  SpO2: 96% 95% 97% 95%  Weight:      Height:        General: Pt is alert, awake, not in acute distress. Pleasantly confused Cardiovascular:  S1/S2 +, no rubs, no gallops Respiratory:  CTA bilaterally, no wheezing, no rhonchi Abdominal: Soft, NT, ND, bowel sounds + Extremities: no cyanosis    The results of significant diagnostics from this  hospitalization (including imaging, microbiology, ancillary and laboratory) are listed below for reference.     Microbiology: Recent Results (from the past 240 hours)  MRSA Next Gen by PCR, Nasal     Status: None   Collection Time: 02/25/24  4:37 PM   Specimen: Nasal Mucosa; Nasal Swab  Result Value Ref Range Status   MRSA by PCR Next Gen NOT DETECTED NOT DETECTED Final    Comment: (NOTE) The GeneXpert MRSA Assay (FDA approved for NASAL specimens only), is one component of a comprehensive MRSA colonization surveillance program. It is not intended to diagnose MRSA infection nor to guide or monitor treatment for MRSA infections. Test performance is not FDA approved in patients less than 25 years old. Performed at [facility], [address] [place], [place] [address]      Labs: BNP (last 3 results) No results for input(s): BNP in the last 8760 hours. Basic Metabolic Panel: Recent Labs  Lab 02/25/24 1025 02/26/24 0521 02/27/24 0209 02/28/24 0346 03/02/24 1056  NA 138 139 138 135 136  K 3.8 3.9 3.5 3.5 3.8  CL 99 100 97* 97* 95*  CO2 26 28 28 27 28   GLUCOSE 100* 90 87 86 95  BUN 28* 28* 31* 36* 47*  CREATININE 0.95 1.01* 1.17* 1.17* 1.12*  CALCIUM 9.2 9.3 9.4 9.1 9.6  MG  --  1.9 1.8 2.7*  --   PHOS  --  3.5 3.0 3.4  --    Liver Function Tests: Recent Labs  Lab 02/25/24 1025 02/26/24 0521 02/27/24 0209 02/28/24 0346  AST 29  --   --   --   ALT 11  --   --   --   ALKPHOS 53  --   --   --   BILITOT 0.8  --   --   --   PROT 6.7  --   --   --   ALBUMIN 3.7 3.6 3.8 3.7   No results for input(s): LIPASE, AMYLASE in the last 168 hours. No results for input(s): AMMONIA in the last 168 hours. CBC: Recent Labs  Lab 02/25/24 1025 02/26/24 0521 02/27/24 0209 02/28/24 0346 03/02/24 1056  WBC 5.7 7.3 7.7 6.7 5.7  NEUTROABS 4.5  --   --   --   --   HGB 13.6 13.6 14.3 14.8 14.5  HCT 42.4 40.1 43.7 43.9 45.1  MCV 99.1 95.5 97.3 95.4 97.6  PLT  120* 160 160 146* 166   Cardiac Enzymes: Recent Labs  Lab 02/25/24 1025  CKTOTAL 57   BNP: Invalid input(s): POCBNP CBG: Recent Labs  Lab 02/25/24 1620  GLUCAP 96   D-Dimer No results for input(s): DDIMER in the last 72 hours. Hgb A1c No results for input(s): HGBA1C in the last 72 hours. Lipid Profile No results for input(s): CHOL, HDL, LDLCALC, TRIG, CHOLHDL, LDLDIRECT in the last 72 hours. Thyroid  function studies No results for input(s): TSH, T4TOTAL, T3FREE, THYROIDAB in the last 72 hours.  Invalid input(s): FREET3 Anemia work up No results for input(s): VITAMINB12, FOLATE, FERRITIN, TIBC, IRON, RETICCTPCT in the last 72 hours. Urinalysis    Component Value Date/Time   COLORURINE YELLOW (A) 08/31/2021 1350   APPEARANCEUR CLEAR (A) 08/31/2021 1350   LABSPEC 1.005 08/31/2021 1350   PHURINE 8.0 08/31/2021 1350   GLUCOSEU NEGATIVE 08/31/2021 1350  HGBUR NEGATIVE 08/31/2021 1350   BILIRUBINUR NEGATIVE 08/31/2021 1350   KETONESUR NEGATIVE 08/31/2021 1350   PROTEINUR NEGATIVE 08/31/2021 1350   NITRITE NEGATIVE 08/31/2021 1350   LEUKOCYTESUR NEGATIVE 08/31/2021 1350   Sepsis Labs Recent Labs  Lab 02/26/24 0521 02/27/24 0209 02/28/24 0346 03/02/24 1056  WBC 7.3 7.7 6.7 5.7   Microbiology Recent Results (from the past 240 hours)  MRSA Next Gen by PCR, Nasal     Status: None   Collection Time: 02/25/24  4:37 PM   Specimen: Nasal Mucosa; Nasal Swab  Result Value Ref Range Status   MRSA by PCR Next Gen NOT DETECTED NOT DETECTED Final    Comment: (NOTE) The GeneXpert MRSA Assay (FDA approved for NASAL specimens only), is one component of a comprehensive MRSA colonization surveillance program. It is not intended to diagnose MRSA infection nor to guide or monitor treatment for MRSA infections. Test performance is not FDA approved in patients less than 58 years old. Performed at [facility], [address]  [place], [place] [address]      Time coordinating discharge: 36 minutes  SIGNED:   Anthony CHRISTELLA Pouch, MD  [facility] 03/02/2024, 12:30 PM Pager   If 7PM-7AM, please contact night-coverage [email or web address]
